# Patient Record
Sex: Female | Born: 1990
Health system: Southern US, Community
[De-identification: ages and names within clinical notes are randomized; demographics above are authoritative.]

## PROBLEM LIST (undated history)

## (undated) ENCOUNTER — Inpatient Hospital Stay (HOSPITAL_COMMUNITY): Payer: Self-pay

## (undated) DIAGNOSIS — N83201 Unspecified ovarian cyst, right side: Secondary | ICD-10-CM

## (undated) DIAGNOSIS — F32A Depression, unspecified: Secondary | ICD-10-CM

## (undated) DIAGNOSIS — D649 Anemia, unspecified: Secondary | ICD-10-CM

## (undated) DIAGNOSIS — R519 Headache, unspecified: Secondary | ICD-10-CM

## (undated) DIAGNOSIS — O24419 Gestational diabetes mellitus in pregnancy, unspecified control: Secondary | ICD-10-CM

## (undated) DIAGNOSIS — F419 Anxiety disorder, unspecified: Secondary | ICD-10-CM

## (undated) DIAGNOSIS — R112 Nausea with vomiting, unspecified: Secondary | ICD-10-CM

## (undated) DIAGNOSIS — N390 Urinary tract infection, site not specified: Secondary | ICD-10-CM

## (undated) DIAGNOSIS — F329 Major depressive disorder, single episode, unspecified: Secondary | ICD-10-CM

## (undated) DIAGNOSIS — N83202 Unspecified ovarian cyst, left side: Secondary | ICD-10-CM

## (undated) DIAGNOSIS — Z9889 Other specified postprocedural states: Secondary | ICD-10-CM

## (undated) DIAGNOSIS — O471 False labor at or after 37 completed weeks of gestation: Principal | ICD-10-CM

## (undated) HISTORY — PX: ADENOIDECTOMY: SUR15

## (undated) HISTORY — PX: ADENOIDECTOMY AND MYRINGOTOMY WITH TUBE PLACEMENT: SHX5714

## (undated) HISTORY — PX: EYE SURGERY: SHX253

---
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
## 2017-07-03 LAB — OB RESULTS CONSOLE GC/CHLAMYDIA
CHLAMYDIA, DNA PROBE: NEGATIVE
GC PROBE AMP, GENITAL: NEGATIVE

## 2017-07-03 LAB — OB RESULTS CONSOLE HEPATITIS B SURFACE ANTIGEN: HEP B S AG: NEGATIVE

## 2017-07-03 LAB — OB RESULTS CONSOLE RUBELLA ANTIBODY, IGM: Rubella: IMMUNE

## 2017-07-03 LAB — OB RESULTS CONSOLE ABO/RH: RH TYPE: POSITIVE

## 2017-07-03 LAB — OB RESULTS CONSOLE HIV ANTIBODY (ROUTINE TESTING): HIV: NONREACTIVE

## 2017-07-03 LAB — OB RESULTS CONSOLE RPR: RPR: NONREACTIVE

## 2017-07-03 LAB — OB RESULTS CONSOLE ANTIBODY SCREEN: ANTIBODY SCREEN: NEGATIVE

## 2017-08-28 NOTE — L&D Delivery Note (Addendum)
Delivery Note  [facility]  03/05/2018  Date of delivery: 03/06/18  Pamela EhrichMeighan A Bell, 27 y.o., @ 7025w4d,  G1P0, who was admitted for latent phase labor with SROM with light meconium. I was called to the room when she progressed +1 station in the second stage of labor. Pt was feeling pressure in her rectum with an epidural, infant was having a prolonged decel, nadir of 60 x 3 mins then slowly returned to baseline of 135s with rollin gpt to left and right, o2 Pamela Bell, and bolus, Dr Mora ApplPinn was called to bedside to help assist.  She pushed for 45hours/min.  She delivered a viable infant, cephalic and restituted to the ROT position over an intact perineum.  A nuchal cord   was not identified. The baby was placed on maternal abdomen while initial step of NRP were perfmored (Dry, Stimulated, and warmed). Hat placed on baby for thermoregulation. Delayed cord clamping was performed for 1 minutes.  Cord double clamped and cut.  Cord cut by father. Apgar scores were 8 and 9. The placenta delivered spontaneously, shultz, with a 3 vessel cord.  Inspection revealed 1st degree. An examination of the vaginal vault and cervix was free from lacerations. The uterus was firm, bleeding stable.  The repair was done under local.   Placenta was sent to LD and umbilical artery blood gas were not sent.  There were no complications during the procedure.  Mom and baby skin to skin following delivery. Left in stable condition.  Maternal Info: Anesthesia:Epidural Episiotomy: no Lacerations:  1st Suture Repair: 2-0 vicryl & 3-0 chromic Est. Blood Loss (mL):  400  Newborn Info: Baby Sex: female Circumcision: No APGAR (1 MIN):   APGAR (5 MINS):   APGAR (10 MINS):     Mom to postpartum.  Baby to Couplet care / Skin to Skin.   Clement Deneault, CNM, NP-C @TODAY @ 2:05 AM

## 2017-10-09 ENCOUNTER — Inpatient Hospital Stay (HOSPITAL_COMMUNITY)
Admission: AD | Admit: 2017-10-09 | Discharge: 2017-10-09 | Disposition: A | Discharge status: Home / Self Care | Payer: Medicaid Other | Source: Ambulatory Visit | Attending: Obstetrics & Gynecology | Admitting: Obstetrics & Gynecology

## 2017-10-09 ENCOUNTER — Encounter (HOSPITAL_COMMUNITY): Payer: Self-pay | Admitting: *Deleted

## 2017-10-09 DIAGNOSIS — O26892 Other specified pregnancy related conditions, second trimester: Secondary | ICD-10-CM | POA: Diagnosis present

## 2017-10-09 DIAGNOSIS — O99512 Diseases of the respiratory system complicating pregnancy, second trimester: Secondary | ICD-10-CM | POA: Insufficient documentation

## 2017-10-09 DIAGNOSIS — O219 Vomiting of pregnancy, unspecified: Secondary | ICD-10-CM

## 2017-10-09 DIAGNOSIS — J101 Influenza due to other identified influenza virus with other respiratory manifestations: Secondary | ICD-10-CM | POA: Insufficient documentation

## 2017-10-09 DIAGNOSIS — Z3A19 19 weeks gestation of pregnancy: Secondary | ICD-10-CM | POA: Diagnosis not present

## 2017-10-09 DIAGNOSIS — R112 Nausea with vomiting, unspecified: Secondary | ICD-10-CM | POA: Diagnosis present

## 2017-10-09 MED ORDER — PROMETHAZINE HCL 12.5 MG PO TABS: 12.5000 mg | ORAL_TABLET | Freq: Four times a day (QID) | ORAL | 2 refills | Status: DC | PRN

## 2017-10-09 NOTE — MAU Provider Note (Signed)
Chief Complaint: Emesis; Nausea; Diarrhea; Fatigue; Sore Throat; and Cough   First Provider Initiated Contact with Patient 10/09/17 1550     SUBJECTIVE HPI: Pamela Bell is a 27 y.o. G1P0 at [redacted]w[redacted]d by LMP who presents to maternity admissions reporting nausea and vomiting after taking Tamiflu. She was diagnosed yesterday afternoon with influenza A and was prescribed Tamiflu- currently on her second dose of medication and c/o vomiting after medication one time this morning. She has Zofran prescribed at home but is unable to take medication d/t it making her even more sick. She denies concerns with pregnancy. She denies vaginal bleeding, vaginal itching/burning, urinary symptoms, h/a, dizziness, n/v, or fever/chills.    No past medical history on file.  Social History   Socioeconomic History  . Marital status: Single    Spouse name: Not on file  . Number of children: Not on file  . Years of education: Not on file  . Highest education level: Not on file  Social Needs  . Financial resource strain: Not on file  . Food insecurity - worry: Not on file  . Food insecurity - inability: Not on file  . Transportation needs - medical: Not on file  . Transportation needs - non-medical: Not on file  Occupational History  . Not on file  Tobacco Use  . Smoking status: Not on file  Substance and Sexual Activity  . Alcohol use: Not on file  . Drug use: Not on file  . Sexual activity: Not on file  Other Topics Concern  . Not on file  Social History Narrative  . Not on file   No current facility-administered medications on file prior to encounter.    No current outpatient medications on file prior to encounter.   Allergies  Allergen Reactions  . Amoxicillin-Pot Clavulanate     REACTION: Hives    ROS:  Review of Systems  Gastrointestinal: Positive for nausea and vomiting.  Genitourinary: Negative.   Musculoskeletal: Negative.   Neurological: Negative.   Psychiatric/Behavioral:  Negative.   Positive for flu symptoms   I have reviewed patient's Past Medical Hx, Surgical Hx, Family Hx, Social Hx, medications and allergies.   Physical Exam   Patient Vitals for the past 24 hrs:  BP Temp Temp src Pulse Resp SpO2 Height Weight  10/09/17 1545 114/72 (!) 97.5 F (36.4 C) Oral 78 18 99 % 5\' 2"  (1.575 m) 169 lb (76.7 kg)   Constitutional: Well-developed, well-nourished female in no acute distress.  Cardiovascular: normal rate Respiratory: normal effort, clear lung sounds bilaterally  GI: Abd soft, non-tender. Pos BS x 4 MS: Extremities nontender, no edema, normal ROM Neurologic: Alert and oriented x 4.  GU: Neg CVAT.  FHT 145 by doppler   MAU Management/MDM:   Meds ordered this encounter  Medications  . promethazine (PHENERGAN) 12.5 MG tablet    Sig: Take 1 tablet (12.5 mg total) by mouth every 6 (six) hours as needed for nausea or vomiting.    Dispense:  30 tablet    Refill:  2    Order Specific Question:   Supervising Provider    Answer:   CONSTANT, PEGGY [[id number]]    Consult Dr Charlotta Newton on assessment and management. Recommends to call office if symptoms worsen and unable to keep medication down  Pt discharged with strict flu precautions and work note to stay home from work and school.  ASSESSMENT 1. Influenza A   2. Nausea and vomiting during pregnancy     PLAN Discharge  home Rx for phenergan prescription given to patient  Follow up as scheduled for prenatal visits   Follow-up Information    [facility]   Specialty:  Obstetrics and Gynecology Why:  Follow up as scheduled for prenatal visits Contact information: [address] [place] [id number] [phone]           Allergies as of 10/09/2017      Reactions   Amoxicillin-pot Clavulanate    REACTION: Hives      Medication List    TAKE these medications   promethazine 12.5 MG tablet Commonly known as:  PHENERGAN Take 1 tablet (12.5 mg  total) by mouth every 6 (six) hours as needed for nausea or vomiting.       Steward DroneVeronica Bob Daversa  Certified Nurse-Midwife 10/09/2017  4:27 PM

## 2017-10-09 NOTE — MAU Note (Signed)
Pt reports she tested positive for flu yesterday, has been sick for a week, vomiting, sore throat, cough, fever, weak, loose stools.

## 2017-12-17 ENCOUNTER — Encounter (HOSPITAL_COMMUNITY): Payer: Self-pay

## 2017-12-17 ENCOUNTER — Inpatient Hospital Stay (HOSPITAL_COMMUNITY)
Admission: AD | Admit: 2017-12-17 | Discharge: 2017-12-17 | Disposition: A | Discharge status: Home / Self Care | Payer: Medicaid Other | Source: Ambulatory Visit | Attending: Obstetrics and Gynecology | Admitting: Obstetrics and Gynecology

## 2017-12-17 DIAGNOSIS — N83201 Unspecified ovarian cyst, right side: Secondary | ICD-10-CM | POA: Diagnosis not present

## 2017-12-17 DIAGNOSIS — O26893 Other specified pregnancy related conditions, third trimester: Secondary | ICD-10-CM | POA: Diagnosis present

## 2017-12-17 DIAGNOSIS — N83202 Unspecified ovarian cyst, left side: Secondary | ICD-10-CM | POA: Insufficient documentation

## 2017-12-17 DIAGNOSIS — O479 False labor, unspecified: Secondary | ICD-10-CM

## 2017-12-17 DIAGNOSIS — F419 Anxiety disorder, unspecified: Secondary | ICD-10-CM | POA: Diagnosis not present

## 2017-12-17 DIAGNOSIS — Z880 Allergy status to penicillin: Secondary | ICD-10-CM | POA: Insufficient documentation

## 2017-12-17 DIAGNOSIS — O3483 Maternal care for other abnormalities of pelvic organs, third trimester: Secondary | ICD-10-CM | POA: Insufficient documentation

## 2017-12-17 DIAGNOSIS — M549 Dorsalgia, unspecified: Secondary | ICD-10-CM | POA: Insufficient documentation

## 2017-12-17 DIAGNOSIS — F329 Major depressive disorder, single episode, unspecified: Secondary | ICD-10-CM | POA: Insufficient documentation

## 2017-12-17 DIAGNOSIS — Z3A29 29 weeks gestation of pregnancy: Secondary | ICD-10-CM

## 2017-12-17 DIAGNOSIS — O99343 Other mental disorders complicating pregnancy, third trimester: Secondary | ICD-10-CM | POA: Insufficient documentation

## 2017-12-17 DIAGNOSIS — O4703 False labor before 37 completed weeks of gestation, third trimester: Secondary | ICD-10-CM

## 2017-12-17 DIAGNOSIS — R109 Unspecified abdominal pain: Secondary | ICD-10-CM | POA: Insufficient documentation

## 2017-12-17 HISTORY — DX: Anxiety disorder, unspecified: F41.9

## 2017-12-17 HISTORY — DX: Unspecified ovarian cyst, right side: N83.201

## 2017-12-17 HISTORY — DX: Depression, unspecified: F32.A

## 2017-12-17 HISTORY — DX: Major depressive disorder, single episode, unspecified: F32.9

## 2017-12-17 HISTORY — DX: Unspecified ovarian cyst, left side: N83.202

## 2017-12-17 LAB — URINALYSIS, ROUTINE W REFLEX MICROSCOPIC
Bilirubin Urine: NEGATIVE
GLUCOSE, UA: NEGATIVE mg/dL
HGB URINE DIPSTICK: NEGATIVE
KETONES UR: NEGATIVE mg/dL
Leukocytes, UA: NEGATIVE
Nitrite: NEGATIVE
PROTEIN: NEGATIVE mg/dL
Specific Gravity, Urine: 1.002 — ABNORMAL LOW (ref 1.005–1.030)
pH: 7 (ref 5.0–8.0)

## 2017-12-17 NOTE — MAU Provider Note (Signed)
Chief Complaint:  Abdominal Pain and Back Pain   First Provider Initiated Contact with Patient 12/17/17 1326      HPI: Pamela Bell is a 27 y.o. G1P0 at 8044w2d who presents to maternity admissions reporting onset of painful cramping and back pain today while standing at clinicals. The pain was enough to make her double over and have to breath through the pain. She was unable to time them so is not sure how far apart they were. They are not as strong but are still happening now, at least 1 in the last 30 minutes.   Last intercourse was last night.  She reports drinking plenty of water. She has not tried any other treatments. There are no other associated symptoms. She reports good fetal movement, denies LOF, vaginal bleeding, vaginal itching/burning, urinary symptoms, h/a, dizziness, n/v, or fever/chills.    HPI  Past Medical History: Past Medical History:  Diagnosis Date  . Anxiety   . Bilateral ovarian cysts   . Depression    takes zoloft    Past obstetric history: OB History  Gravida Para Term Preterm AB Living  1            SAB TAB Ectopic Multiple Live Births               # Outcome Date GA Lbr Len/2nd Weight Sex Delivery Anes PTL Lv  1 Current             Past Surgical History: Past Surgical History:  Procedure Laterality Date  . ADENOIDECTOMY    . ADENOIDECTOMY AND MYRINGOTOMY WITH TUBE PLACEMENT    . EYE SURGERY      Family History: No family history on file.  Social History: Social History   Tobacco Use  . Smoking status: Never Smoker  . Smokeless tobacco: Never Used  Substance Use Topics  . Alcohol use: Not Currently  . Drug use: Never    Allergies:  Allergies  Allergen Reactions  . Amoxicillin-Pot Clavulanate     REACTION: Hives  . Red Dye Hives    Meds:  No medications prior to admission.    ROS:  Review of Systems  Constitutional: Negative for chills, fatigue and fever.  Respiratory: Negative for shortness of breath.   Cardiovascular:  Negative for chest pain.  Gastrointestinal: Positive for abdominal pain. Negative for nausea and vomiting.  Genitourinary: Positive for pelvic pain. Negative for difficulty urinating, dysuria, flank pain, vaginal bleeding, vaginal discharge and vaginal pain.  Musculoskeletal: Positive for back pain.  Neurological: Negative for dizziness and headaches.  Psychiatric/Behavioral: Negative.      I have reviewed patient's Past Medical Hx, Surgical Hx, Family Hx, Social Hx, medications and allergies.   Physical Exam   Patient Vitals for the past 24 hrs:  BP Temp Temp src Pulse Resp SpO2 Height Weight  12/17/17 1408 123/69 98.1 F (36.7 C) Oral 85 16 - - -  12/17/17 1235 128/73 98.4 F (36.9 C) Oral 82 19 98 % 5\' 2"  (1.575 m) 185 lb (83.9 kg)   Constitutional: Well-developed, well-nourished female in no acute distress.  Cardiovascular: normal rate Respiratory: normal effort GI: Abd soft, non-tender, gravid appropriate for gestational age.  MS: Extremities nontender, no edema, normal ROM Neurologic: Alert and oriented x 4.  GU: Neg CVAT.   Dilation: Closed Effacement (%): Thick Exam by:: Sharen CounterLisa Leftwich-Kirby, CNM  FHT:  Baseline 135 , moderate variability, accelerations present, no decelerations Contractions: irritability, irregular, mild to palpation   Labs: Results for  orders placed or performed during the hospital encounter of 12/17/17 (from the past 24 hour(s))  Urinalysis, Routine w reflex microscopic     Status: Abnormal   Collection Time: 12/17/17 12:35 PM  Result Value Ref Range   Color, Urine STRAW (A) YELLOW   APPearance CLEAR CLEAR   Specific Gravity, Urine 1.002 (L) 1.005 - 1.030   pH 7.0 5.0 - 8.0   Glucose, UA NEGATIVE NEGATIVE mg/dL   Hgb urine dipstick NEGATIVE NEGATIVE   Bilirubin Urine NEGATIVE NEGATIVE   Ketones, ur NEGATIVE NEGATIVE mg/dL   Protein, ur NEGATIVE NEGATIVE mg/dL   Nitrite NEGATIVE NEGATIVE   Leukocytes, UA NEGATIVE NEGATIVE      Imaging:   No results found.  MAU Course/MDM: I have ordered labs and reviewed results.  NST reviewed and reactive   No evidence of preterm labor Braxton-hicks contractions may be associated with recent intercourse or increase in physical activity over the weekend and today at clinicals Pt reports some recent panic attacks despite Zoloft daily but she does not want to increase her dose. She is doing well, she reports and thinks she may have too much stress from school right now Consult Dr Dion Body with presentation, exam findings and test results Pt to f/u in office Recommend pt seek counseling, use relaxation techniques to manage stress Pt discharge with strict preterm labor precautions.   Assessment: 1. Braxton Hicks contractions   2. Anxiety during pregnancy, antepartum, third trimester     Plan: Discharge home Labor precautions and fetal kick counts Follow-up Information    Myna Hidalgo, DO Follow up.   Specialty:  Obstetrics and Gynecology Why:  As scheduled, return to MAU as needed for emergencies. Contact information: [address] [place] [address] [phone]          Allergies as of 12/17/2017      Reactions   Amoxicillin-pot Clavulanate    REACTION: Hives   Red Dye Hives      Medication List    TAKE these medications   promethazine 12.5 MG tablet Commonly known as:  PHENERGAN Take 1 tablet (12.5 mg total) by mouth every 6 (six) hours as needed for nausea or vomiting.       Sharen Counter Certified Nurse-Midwife 12/17/2017 9:15 PM

## 2017-12-17 NOTE — MAU Note (Signed)
Pt reports onset of severe cramping about 45 minutes , now radiating into her lower back. Denies bleeding or ROM

## 2017-12-30 ENCOUNTER — Encounter (HOSPITAL_COMMUNITY): Payer: Self-pay | Admitting: *Deleted

## 2017-12-30 ENCOUNTER — Inpatient Hospital Stay (HOSPITAL_COMMUNITY)
Admission: AD | Admit: 2017-12-30 | Discharge: 2017-12-30 | Disposition: A | Discharge status: Home / Self Care | Payer: Medicaid Other | Source: Ambulatory Visit | Attending: Obstetrics & Gynecology | Admitting: Obstetrics & Gynecology

## 2017-12-30 DIAGNOSIS — Z3A31 31 weeks gestation of pregnancy: Secondary | ICD-10-CM | POA: Diagnosis not present

## 2017-12-30 DIAGNOSIS — R1030 Lower abdominal pain, unspecified: Secondary | ICD-10-CM | POA: Diagnosis present

## 2017-12-30 DIAGNOSIS — O4703 False labor before 37 completed weeks of gestation, third trimester: Secondary | ICD-10-CM | POA: Diagnosis not present

## 2017-12-30 LAB — WET PREP, GENITAL
Clue Cells Wet Prep HPF POC: NONE SEEN
SPERM: NONE SEEN
TRICH WET PREP: NONE SEEN
Yeast Wet Prep HPF POC: NONE SEEN

## 2017-12-30 LAB — URINALYSIS, ROUTINE W REFLEX MICROSCOPIC
Bilirubin Urine: NEGATIVE
GLUCOSE, UA: NEGATIVE mg/dL
HGB URINE DIPSTICK: NEGATIVE
KETONES UR: NEGATIVE mg/dL
LEUKOCYTES UA: NEGATIVE
Nitrite: NEGATIVE
PROTEIN: NEGATIVE mg/dL
Specific Gravity, Urine: 1.004 — ABNORMAL LOW (ref 1.005–1.030)
pH: 8 (ref 5.0–8.0)

## 2017-12-30 LAB — POCT FERN TEST: POCT FERN TEST: NEGATIVE

## 2017-12-30 LAB — FETAL FIBRONECTIN: Fetal Fibronectin: NEGATIVE

## 2017-12-30 MED ORDER — NIFEDIPINE 10 MG PO CAPS
10.0000 mg | ORAL_CAPSULE | ORAL | Status: DC | PRN
  Administered 2017-12-30 (×2): 10 mg via ORAL
  Filled 2017-12-30 (×2): qty 1

## 2017-12-30 MED ORDER — NIFEDIPINE 10 MG PO CAPS: 10.0000 mg | ORAL_CAPSULE | Freq: Four times a day (QID) | ORAL | 0 refills | Status: DC | PRN

## 2017-12-30 NOTE — Discharge Instructions (Signed)
Braxton Hicks Contractions °Contractions of the uterus can occur throughout pregnancy, but they are not always a sign that you are in labor. You may have practice contractions called Braxton Hicks contractions. These false labor contractions are sometimes confused with true labor. °What are Braxton Hicks contractions? °Braxton Hicks contractions are tightening movements that occur in the muscles of the uterus before labor. Unlike true labor contractions, these contractions do not result in opening (dilation) and thinning of the cervix. Toward the end of pregnancy (32-34 weeks), Braxton Hicks contractions can happen more often and may become stronger. These contractions are sometimes difficult to tell apart from true labor because they can be very uncomfortable. You should not feel embarrassed if you go to the hospital with false labor. °Sometimes, the only way to tell if you are in true labor is for your health care provider to look for changes in the cervix. The health care provider will do a physical exam and may monitor your contractions. If you are not in true labor, the exam should show that your cervix is not dilating and your water has not broken. °If there are other health problems associated with your pregnancy, it is completely safe for you to be sent home with false labor. You may continue to have Braxton Hicks contractions until you go into true labor. °How to tell the difference between true labor and false labor °True labor °· Contractions last 30-70 seconds. °· Contractions become very regular. °· Discomfort is usually felt in the top of the uterus, and it spreads to the lower abdomen and low back. °· Contractions do not go away with walking. °· Contractions usually become more intense and increase in frequency. °· The cervix dilates and gets thinner. °False labor °· Contractions are usually shorter and not as strong as true labor contractions. °· Contractions are usually irregular. °· Contractions  are often felt in the front of the lower abdomen and in the groin. °· Contractions may go away when you walk around or change positions while lying down. °· Contractions get weaker and are shorter-lasting as time goes on. °· The cervix usually does not dilate or become thin. °Follow these instructions at home: °· Take over-the-counter and prescription medicines only as told by your health care provider. °· Keep up with your usual exercises and follow other instructions from your health care provider. °· Eat and drink lightly if you think you are going into labor. °· If Braxton Hicks contractions are making you uncomfortable: °? Change your position from lying down or resting to walking, or change from walking to resting. °? Sit and rest in a tub of warm water. °? Drink enough fluid to keep your urine pale yellow. Dehydration may cause these contractions. °? Do slow and deep breathing several times an hour. °· Keep all follow-up prenatal visits as told by your health care provider. This is important. °Contact a health care provider if: °· You have a fever. °· You have continuous pain in your abdomen. °Get help right away if: °· Your contractions become stronger, more regular, and closer together. °· You have fluid leaking or gushing from your vagina. °· You pass blood-tinged mucus (bloody show). °· You have bleeding from your vagina. °· You have low back pain that you never had before. °· You feel your baby’s head pushing down and causing pelvic pressure. °· Your baby is not moving inside you as much as it used to. °Summary °· Contractions that occur before labor are called Braxton   Hicks contractions, false labor, or practice contractions. °· Braxton Hicks contractions are usually shorter, weaker, farther apart, and less regular than true labor contractions. True labor contractions usually become progressively stronger and regular and they become more frequent. °· Manage discomfort from Braxton Hicks contractions by  changing position, resting in a warm bath, drinking plenty of water, or practicing deep breathing. °This information is not intended to replace advice given to you by your health care provider. Make sure you discuss any questions you have with your health care provider. °Document Released: 12/28/2016 Document Revised: 12/28/2016 Document Reviewed: 12/28/2016 °Elsevier Interactive Patient Education © 2018 [employer] ° °

## 2017-12-30 NOTE — MAU Note (Addendum)
Patient reports abdominal pain that radiates to lower back. Patient describes pain as cramping in nature. She states it feels like her stomach is in knots at times. Pain 8/10 Tried tylenol extra strength yesterday; helped " a little".  Pain started yesterday but got more severe this am around 1015 and has been more constant. +nausea; denies vomiting.  Reports clear discharge.  Denies vaginal bleeding.  +FM; less movement than usus al since last night

## 2017-12-30 NOTE — MAU Provider Note (Signed)
CC:  Chief Complaint  Patient presents with  . Contractions     First Provider Initiated Contact with Patient 12/30/17 1353      HPI: Pamela Bell is a 27 y.o. year old G1P0 female at [redacted]w[redacted]d weeks gestation who presents to MAU reporting low abdominal pain and tightening every 5 minutes since last night.   Associated Sx:  Vaginal bleeding: Denies Leaking of fluid: Reports feeling dampness in underwear, but no leaking seen. Fetal movement: Normal  Course: Worsening Aggravating or alleviating factors: No improvement with rest and hydration.  O:  Patient Vitals for the past 24 hrs:  BP Temp Temp src Pulse Resp Height Weight  12/30/17 1514 113/61 - - - - - -  12/30/17 1317 126/77 97.9 F (36.6 C) Oral (!) 108 17 - -  12/30/17 1310 - - - - -  (1.575 m) 184 lb (83.5 kg)    General: Patient in mild distress. Heart: Regular rate Lungs: Normal rate and effort Abd: Soft, NT, Gravid, S=D Pelvic: NEFG, negative pooling, negative blood.  Dilation: Closed Effacement (%): Thick Exam by:: Ivonne Andrew, CNM  EFM: 145, Moderate variability, 15 x 15 accelerations, no decelerations Toco: None tracing on toco.  Contractions every 5 minutes palpation, mild-moderate  Results for orders placed or performed during the hospital encounter of 12/30/17 (from the past 24 hour(s))  Urinalysis, Routine w reflex microscopic     Status: Abnormal   Collection Time: 12/30/17  1:11 PM  Result Value Ref Range   Color, Urine YELLOW YELLOW   APPearance CLEAR CLEAR   Specific Gravity, Urine 1.004 (L) 1.005 - 1.030   pH 8.0 5.0 - 8.0   Glucose, UA NEGATIVE NEGATIVE mg/dL   Hgb urine dipstick NEGATIVE NEGATIVE   Bilirubin Urine NEGATIVE NEGATIVE   Ketones, ur NEGATIVE NEGATIVE mg/dL   Protein, ur NEGATIVE NEGATIVE mg/dL   Nitrite NEGATIVE NEGATIVE   Leukocytes, UA NEGATIVE NEGATIVE  Fetal fibronectin     Status: Abnormal   Collection Time: 12/30/17  2:09 PM  Result Value Ref Range   Fetal  Fibronectin NEGATIVE NEGATIVE   Appearance, FETFIB VALID (A) CLEAR  Wet prep, genital     Status: Abnormal   Collection Time: 12/30/17  2:09 PM  Result Value Ref Range   Yeast Wet Prep HPF POC NONE SEEN NONE SEEN   Trich, Wet Prep NONE SEEN NONE SEEN   Clue Cells Wet Prep HPF POC NONE SEEN NONE SEEN   WBC, Wet Prep HPF POC FEW (A) NONE SEEN   Sperm NONE SEEN    Negative fern  Orders Placed This Encounter  Procedures  . Wet prep, genital  . Urinalysis, Routine w reflex microscopic  . Fetal fibronectin  . Encourage fluids  . Discharge patient  [name]  Meds ordered this encounter  Medications  . NIFEdipine (PROCARDIA) capsule 10 mg  . NIFEdipine (PROCARDIA) 10 MG capsule    Sig: Take 1 capsule (10 mg total) by mouth every 6 (six) hours as needed (for greater than 5 contractions per hour).    Dispense:  30 capsule    Refill:  0    Order Specific Question:   Supervising Provider    Answer:   Adam Phenix [[id number]]   Discussed Hx, labs, exam w/ Dr. Normand Sloop. Agrees w/ POC. New orders: Procardia, push fluids, send fetal fibronectin, wet prep and cultures.   A: [redacted]w[redacted]d week IUP Preterm contractions without evidence of active preterm labor.  Fetal fibronectin negative.  No  evidence of ruptured membranes. FHR reactive  P: Discharge home in stable condition per consult with Dr. Normand Sloop. Preterm labor precautions and fetal kick counts. Rx Procardia.  For > than 5 contractions/hour. Follow-up as scheduled for prenatal visit or sooner as needed if symptoms worsen. Return to maternity admissions as needed if symptoms worsen.  Katrinka Blazing, [place], CNM 12/30/2017 4:21 PM  3

## 2017-12-31 LAB — GC/CHLAMYDIA PROBE AMP (~~LOC~~) NOT AT ARMC
CHLAMYDIA, DNA PROBE: NEGATIVE
NEISSERIA GONORRHEA: NEGATIVE

## 2018-02-09 ENCOUNTER — Encounter (HOSPITAL_COMMUNITY): Payer: Self-pay | Admitting: *Deleted

## 2018-02-09 ENCOUNTER — Other Ambulatory Visit: Payer: Self-pay

## 2018-02-09 ENCOUNTER — Observation Stay (HOSPITAL_COMMUNITY)
Admission: AD | Admit: 2018-02-09 | Discharge: 2018-02-10 | Disposition: A | Discharge status: Home / Self Care | Payer: Medicaid Other | Source: Ambulatory Visit | Attending: Obstetrics and Gynecology | Admitting: Obstetrics and Gynecology

## 2018-02-09 DIAGNOSIS — R9431 Abnormal electrocardiogram [ECG] [EKG]: Secondary | ICD-10-CM | POA: Diagnosis present

## 2018-02-09 DIAGNOSIS — O99343 Other mental disorders complicating pregnancy, third trimester: Secondary | ICD-10-CM | POA: Insufficient documentation

## 2018-02-09 DIAGNOSIS — O99513 Diseases of the respiratory system complicating pregnancy, third trimester: Secondary | ICD-10-CM | POA: Insufficient documentation

## 2018-02-09 DIAGNOSIS — Z91030 Bee allergy status: Secondary | ICD-10-CM | POA: Insufficient documentation

## 2018-02-09 DIAGNOSIS — Z91018 Allergy to other foods: Secondary | ICD-10-CM | POA: Insufficient documentation

## 2018-02-09 DIAGNOSIS — Z3689 Encounter for other specified antenatal screening: Secondary | ICD-10-CM

## 2018-02-09 DIAGNOSIS — O368130 Decreased fetal movements, third trimester, not applicable or unspecified: Principal | ICD-10-CM | POA: Insufficient documentation

## 2018-02-09 DIAGNOSIS — F329 Major depressive disorder, single episode, unspecified: Secondary | ICD-10-CM | POA: Insufficient documentation

## 2018-02-09 DIAGNOSIS — Z881 Allergy status to other antibiotic agents status: Secondary | ICD-10-CM | POA: Insufficient documentation

## 2018-02-09 DIAGNOSIS — O9989 Other specified diseases and conditions complicating pregnancy, childbirth and the puerperium: Secondary | ICD-10-CM

## 2018-02-09 DIAGNOSIS — F419 Anxiety disorder, unspecified: Secondary | ICD-10-CM | POA: Insufficient documentation

## 2018-02-09 DIAGNOSIS — Z9102 Food additives allergy status: Secondary | ICD-10-CM | POA: Insufficient documentation

## 2018-02-09 DIAGNOSIS — Z87892 Personal history of anaphylaxis: Secondary | ICD-10-CM | POA: Insufficient documentation

## 2018-02-09 DIAGNOSIS — Z79899 Other long term (current) drug therapy: Secondary | ICD-10-CM | POA: Insufficient documentation

## 2018-02-09 DIAGNOSIS — R0602 Shortness of breath: Secondary | ICD-10-CM | POA: Insufficient documentation

## 2018-02-09 DIAGNOSIS — Z3A37 37 weeks gestation of pregnancy: Secondary | ICD-10-CM | POA: Insufficient documentation

## 2018-02-09 DIAGNOSIS — O368190 Decreased fetal movements, unspecified trimester, not applicable or unspecified: Secondary | ICD-10-CM

## 2018-02-09 DIAGNOSIS — O99891 Other specified diseases and conditions complicating pregnancy: Secondary | ICD-10-CM

## 2018-02-09 DIAGNOSIS — O99413 Diseases of the circulatory system complicating pregnancy, third trimester: Secondary | ICD-10-CM | POA: Insufficient documentation

## 2018-02-09 DIAGNOSIS — R000 Tachycardia, unspecified: Secondary | ICD-10-CM | POA: Insufficient documentation

## 2018-02-09 HISTORY — DX: Other specified postprocedural states: Z98.890

## 2018-02-09 HISTORY — DX: Nausea with vomiting, unspecified: R11.2

## 2018-02-09 NOTE — MAU Provider Note (Signed)
Chief Complaint:  Decreased Fetal Movement   First Provider Initiated Contact with Patient 02/09/18 2354      HPI: Pamela Bell is a 27 y.o. G1P0 at 3037w0dwho presents to maternity admissions reporting she has not felt the baby move as much as usual today.  She has tried to change positions, drink water, and poke her abdomen but the baby has only moved a few times today.  She also reports some intermittent shortness of breath and feeling like her heart is racing. This started 2 weeks ago and occurs at least once every day. It occurs at rest.  It is associated with chest pressure.  There are no other symptoms. She has not tried other treatments. She has no known cardiac hx. She reports some intermittent irregular cramping that is a little more frequent today than it has been this week.  She has hx of anxiety but feels like this heart racing symptom is not like her usual anxiety.  HPI  Past Medical History: Past Medical History:  Diagnosis Date  . Anxiety   . Bilateral ovarian cysts   . Depression    takes zoloft  . PONV (postoperative nausea and vomiting)     Past obstetric history: OB History  Gravida Para Term Preterm AB Living  1            SAB TAB Ectopic Multiple Live Births               # Outcome Date GA Lbr Len/2nd Weight Sex Delivery Anes PTL Lv  1 Current             Past Surgical History: Past Surgical History:  Procedure Laterality Date  . ADENOIDECTOMY    . ADENOIDECTOMY AND MYRINGOTOMY WITH TUBE PLACEMENT    . EYE SURGERY      Family History: Family History  Problem Relation Age of Onset  . ADD / ADHD Neg Hx   . Alcohol abuse Neg Hx   . Anxiety disorder Neg Hx   . Arthritis Neg Hx   . Asthma Neg Hx   . Birth defects Neg Hx   . Cancer Neg Hx   . COPD Neg Hx   . Depression Neg Hx   . Diabetes Neg Hx   . Early death Neg Hx   . Drug abuse Neg Hx   . Hearing loss Neg Hx   . Heart disease Neg Hx   . Hyperlipidemia Neg Hx   . Intellectual disability Neg  Hx   . Hypertension Neg Hx   . Kidney disease Neg Hx   . Learning disabilities Neg Hx   . Miscarriages / Stillbirths Neg Hx   . Obesity Neg Hx   . Stroke Neg Hx   . Vision loss Neg Hx   . Varicose Veins Neg Hx     Social History: Social History   Tobacco Use  . Smoking status: Never Smoker  . Smokeless tobacco: Never Used  Substance Use Topics  . Alcohol use: Not Currently  . Drug use: Never    Allergies:  Allergies  Allergen Reactions  . Bee Venom Anaphylaxis  . Cranberry Anaphylaxis  . Amoxicillin-Pot Clavulanate     REACTION: Hives  . Red Dye Hives    Meds:  Medications Prior to Admission  Medication Sig Dispense Refill Last Dose  . acetaminophen (TYLENOL) 500 MG tablet Take 1,000 mg by mouth every 6 (six) hours as needed for mild pain.   Past Week at Unknown time  .  Prenatal Vit-Fe Fumarate-FA (PRENATAL MULTIVITAMIN) TABS tablet Take 1 tablet by mouth daily at 12 noon.   02/08/2018 at Unknown time  . sertraline (ZOLOFT) 50 MG tablet Take 50 mg by mouth daily.   02/08/2018 at Unknown time  . NIFEdipine (PROCARDIA) 10 MG capsule Take 1 capsule (10 mg total) by mouth every 6 (six) hours as needed (for greater than 5 contractions per hour). 30 capsule 0 More than a month at Unknown time  . promethazine (PHENERGAN) 12.5 MG tablet Take 1 tablet (12.5 mg total) by mouth every 6 (six) hours as needed for nausea or vomiting. 30 tablet 2 More than a month at Unknown time    ROS:  Review of Systems  Constitutional: Negative for chills, fatigue and fever.  Eyes: Negative for visual disturbance.  Respiratory: Positive for chest tightness and shortness of breath.   Cardiovascular: Positive for palpitations. Negative for chest pain.  Gastrointestinal: Negative for abdominal pain, nausea and vomiting.  Genitourinary: Negative for difficulty urinating, dysuria, flank pain, pelvic pain, vaginal bleeding, vaginal discharge and vaginal pain.  Neurological: Negative for dizziness and  headaches.  Psychiatric/Behavioral: Negative.      I have reviewed patient's Past Medical Hx, Surgical Hx, Family Hx, Social Hx, medications and allergies.   Physical Exam   Patient Vitals for the past 24 hrs:  BP Temp Temp src Pulse Resp  02/09/18 2217 123/79 98 F (36.7 C) Oral 81 19   Constitutional: Well-developed, well-nourished female in no acute distress.  HEART: normal rate, heart sounds, regular rhythm RESP: normal effort, lung sounds clear and equal bilaterally GI: Abd soft, non-tender, gravid appropriate for gestational age.  MS: Extremities nontender, no edema, normal ROM Neurologic: Alert and oriented x 4.  GU: Neg CVAT.   Dilation: Closed Effacement (%): 70 Cervical Position: Middle Exam by:: Arbie Cookey CNM   FHT:  Baseline 125 , moderate variability, accelerations present, no decelerations Contractions: q 3-7 mins, mild to palpation   Labs: No results found for this or any previous visit (from the past 24 hour(s)).   EKG: NSR, possible left atrial enlargement, anteroseptal infarct, age undetermined, abnormal EKG  Imaging:  No results found.  MAU Course/MDM: No evidence of labor with closed cervix NST reviewed and reactive but pt continues to report minimal movement despite PO fluids and rest in MAU BPP ordered. Result 8/8 so 10/10 with reactive tracing.  Discussed these findings with pt who was reassured.  She does report fetal movement in MAU but less than usual. EKG done and NSR but possible left atrial enlargement/anterosseptal infarct noted.  Cardiology paged.   Consult Dr Dion Body with assessment and findings.  Dr Dion Body contacted cardiology directly.  Per Dr Mayford Knife, pt should have echo prior to discharge.  Cardiac echo ordered stat, likely to be performed after 7 am with staffing Admit to [facility] Unit for observation, cardiac echo in the am Doppler Q shift, regular diet, up ad lib  Assessment: 1. Abnormal finding on EKG   2. Decreased  fetal movement   3. Shortness of breath during pregnancy   4. Racing heart beat   5. NST (non-stress test) reactive     Plan: Observation in [facility]OB Unit Maternal cardiac echo in the am   Sharen Counter Certified Nurse-Midwife 02/10/2018 2:44 AM

## 2018-02-09 NOTE — MAU Note (Signed)
Pt presents to MAU c/o no FM since 1300. Pt reports she has tried to lay down drink cold fluids eat and poke the baby but she has not felt movement. Pt denies bleeding or LOF and reports and occasional braxton hick ctx. Pt reports over the last two weeks she has felt as if her heart is racing even when she is just lying or sitting. Pt reports it occasionally makes her SOB. Pt states she reported this at her last OB appt but it has since worsened. Pt states at her last dr visit the FHR was also elevated pt received a NST and they said it was normal.

## 2018-02-10 ENCOUNTER — Other Ambulatory Visit: Payer: Self-pay | Admitting: Cardiology

## 2018-02-10 ENCOUNTER — Other Ambulatory Visit: Payer: Self-pay

## 2018-02-10 ENCOUNTER — Inpatient Hospital Stay (HOSPITAL_COMMUNITY): Payer: Medicaid Other

## 2018-02-10 DIAGNOSIS — R000 Tachycardia, unspecified: Secondary | ICD-10-CM | POA: Diagnosis not present

## 2018-02-10 DIAGNOSIS — R9431 Abnormal electrocardiogram [ECG] [EKG]: Secondary | ICD-10-CM | POA: Diagnosis present

## 2018-02-10 DIAGNOSIS — Z79899 Other long term (current) drug therapy: Secondary | ICD-10-CM | POA: Diagnosis not present

## 2018-02-10 DIAGNOSIS — Z87892 Personal history of anaphylaxis: Secondary | ICD-10-CM | POA: Diagnosis not present

## 2018-02-10 DIAGNOSIS — F329 Major depressive disorder, single episode, unspecified: Secondary | ICD-10-CM | POA: Diagnosis not present

## 2018-02-10 DIAGNOSIS — I361 Nonrheumatic tricuspid (valve) insufficiency: Secondary | ICD-10-CM

## 2018-02-10 DIAGNOSIS — O99343 Other mental disorders complicating pregnancy, third trimester: Secondary | ICD-10-CM | POA: Diagnosis not present

## 2018-02-10 DIAGNOSIS — Z91030 Bee allergy status: Secondary | ICD-10-CM | POA: Diagnosis not present

## 2018-02-10 DIAGNOSIS — O368130 Decreased fetal movements, third trimester, not applicable or unspecified: Secondary | ICD-10-CM | POA: Diagnosis not present

## 2018-02-10 DIAGNOSIS — Z3A37 37 weeks gestation of pregnancy: Secondary | ICD-10-CM | POA: Diagnosis not present

## 2018-02-10 DIAGNOSIS — O99413 Diseases of the circulatory system complicating pregnancy, third trimester: Secondary | ICD-10-CM | POA: Diagnosis not present

## 2018-02-10 DIAGNOSIS — Z9102 Food additives allergy status: Secondary | ICD-10-CM | POA: Diagnosis not present

## 2018-02-10 DIAGNOSIS — F419 Anxiety disorder, unspecified: Secondary | ICD-10-CM | POA: Diagnosis not present

## 2018-02-10 DIAGNOSIS — R002 Palpitations: Secondary | ICD-10-CM

## 2018-02-10 DIAGNOSIS — Z91018 Allergy to other foods: Secondary | ICD-10-CM | POA: Diagnosis not present

## 2018-02-10 DIAGNOSIS — O99513 Diseases of the respiratory system complicating pregnancy, third trimester: Secondary | ICD-10-CM | POA: Diagnosis not present

## 2018-02-10 DIAGNOSIS — R0602 Shortness of breath: Secondary | ICD-10-CM | POA: Diagnosis not present

## 2018-02-10 DIAGNOSIS — Z881 Allergy status to other antibiotic agents status: Secondary | ICD-10-CM | POA: Diagnosis not present

## 2018-02-10 LAB — TYPE AND SCREEN
ABO/RH(D): O POS
Antibody Screen: NEGATIVE

## 2018-02-10 LAB — ECHOCARDIOGRAM COMPLETE
HEIGHTINCHES: 62 in
WEIGHTICAEL: 3008 [oz_av]

## 2018-02-10 LAB — ABO/RH: ABO/RH(D): O POS

## 2018-02-10 MED ORDER — SERTRALINE HCL 50 MG PO TABS
50.0000 mg | ORAL_TABLET | Freq: Every day | ORAL | Status: DC
  Filled 2018-02-10: qty 1

## 2018-02-10 MED ORDER — SERTRALINE HCL 50 MG PO TABS
50.0000 mg | ORAL_TABLET | Freq: Every day | ORAL | Status: DC
  Administered 2018-02-10: 50 mg via ORAL
  Filled 2018-02-10 (×2): qty 1

## 2018-02-10 MED ORDER — ACETAMINOPHEN 325 MG PO TABS
650.0000 mg | ORAL_TABLET | ORAL | Status: DC | PRN
Start: 2018-02-10 — End: 2018-02-10

## 2018-02-10 NOTE — Progress Notes (Signed)
  Echocardiogram 2D Echocardiogram has been performed.  Delcie RochENNINGTON, Caiden Monsivais 02/10/2018, 8:33 AM

## 2018-02-10 NOTE — Progress Notes (Signed)
Discharged home in stable condition, ambulatory. 

## 2018-02-10 NOTE — Progress Notes (Signed)
Discharge instructions given to patient and she verbalized understanding of all instructions given. Written copy of AVS given to patient. 

## 2018-02-10 NOTE — Progress Notes (Signed)
   02/10/18 0259  Vital Signs  BP 125/77  BP Location Right Arm  Patient Position (if appropriate) Semi-fowlers  BP Method Automatic  Pulse Rate 74  Pulse Rate Source Monitor  Resp 16  Temp (!) 97.5 F (36.4 C)  Temp Source Oral  Oxygen Therapy  SpO2 100 %  Received patient from MAU, awake, alert and oriented x 4. Pt denies chest pain or any other pain. Pt settled comfortable in bed, oriented to room, SCD on and call bell in close reach. Pt ask if her Mom could come and spent the night with her and she was told yes. She was given a sandwich and plan of care was discussed. Pt verbalized understanding of same.

## 2018-02-24 ENCOUNTER — Encounter (HOSPITAL_COMMUNITY): Payer: Self-pay | Admitting: *Deleted

## 2018-02-24 ENCOUNTER — Other Ambulatory Visit: Payer: Self-pay

## 2018-02-24 ENCOUNTER — Inpatient Hospital Stay (HOSPITAL_COMMUNITY)
Admission: AD | Admit: 2018-02-24 | Discharge: 2018-02-24 | Disposition: A | Discharge status: Home / Self Care | Payer: Medicaid Other | Source: Ambulatory Visit | Attending: Obstetrics & Gynecology | Admitting: Obstetrics & Gynecology

## 2018-02-24 DIAGNOSIS — Z3A39 39 weeks gestation of pregnancy: Secondary | ICD-10-CM | POA: Insufficient documentation

## 2018-02-24 DIAGNOSIS — O479 False labor, unspecified: Secondary | ICD-10-CM

## 2018-02-24 DIAGNOSIS — O471 False labor at or after 37 completed weeks of gestation: Secondary | ICD-10-CM | POA: Diagnosis not present

## 2018-02-24 DIAGNOSIS — Z0371 Encounter for suspected problem with amniotic cavity and membrane ruled out: Secondary | ICD-10-CM | POA: Insufficient documentation

## 2018-02-24 DIAGNOSIS — O26893 Other specified pregnancy related conditions, third trimester: Secondary | ICD-10-CM | POA: Diagnosis present

## 2018-02-24 LAB — AMNISURE RUPTURE OF MEMBRANE (ROM) NOT AT ARMC: Amnisure ROM: NEGATIVE

## 2018-02-24 NOTE — MAU Note (Signed)
Pt report leaking of since 1830. Neg GBS per Pt report. + FM

## 2018-02-24 NOTE — MAU Provider Note (Signed)
Chief Complaint:  Rupture of Membranes   [facility] pt.    HPI: Pamela Bell is a 27 y.o. G1P0 at [redacted]w[redacted]d who presents to maternity admissions reporting felt gush of fluids at 7pm and is not sure if she ruptured or not. Denies contractions, nor vaginal bleeding. Good fetal movement.   Pregnancy Course:   Past Medical History:  Diagnosis Date  . Anxiety   . Bilateral ovarian cysts   . Depression    takes zoloft  . PONV (postoperative nausea and vomiting)    OB History  Gravida Para Term Preterm AB Living  1            SAB TAB Ectopic Multiple Live Births               # Outcome Date GA Lbr Len/2nd Weight Sex Delivery Anes PTL Lv  1 Current            Past Surgical History:  Procedure Laterality Date  . ADENOIDECTOMY    . ADENOIDECTOMY AND MYRINGOTOMY WITH TUBE PLACEMENT    . EYE SURGERY     Family History  Problem Relation Age of Onset  . ADD / ADHD Neg Hx   . Alcohol abuse Neg Hx   . Anxiety disorder Neg Hx   . Arthritis Neg Hx   . Asthma Neg Hx   . Birth defects Neg Hx   . Cancer Neg Hx   . COPD Neg Hx   . Depression Neg Hx   . Diabetes Neg Hx   . Early death Neg Hx   . Drug abuse Neg Hx   . Hearing loss Neg Hx   . Heart disease Neg Hx   . Hyperlipidemia Neg Hx   . Intellectual disability Neg Hx   . Hypertension Neg Hx   . Kidney disease Neg Hx   . Learning disabilities Neg Hx   . Miscarriages / Stillbirths Neg Hx   . Obesity Neg Hx   . Stroke Neg Hx   . Vision loss Neg Hx   . Varicose Veins Neg Hx    Social History   Tobacco Use  . Smoking status: Never Smoker  . Smokeless tobacco: Never Used  Substance Use Topics  . Alcohol use: Not Currently  . Drug use: Never   Allergies  Allergen Reactions  . Bee Venom Anaphylaxis  . Cranberry Anaphylaxis  . Amoxicillin-Pot Clavulanate     REACTION: Hives Has patient had a PCN reaction causing immediate rash, facial/tongue/throat swelling, SOB or lightheadedness with hypotension: No Has patient had a PCN  reaction causing severe rash involving mucus membranes or skin necrosis: No Has patient had a PCN reaction that required hospitalization: No Has patient had a PCN reaction occurring within the last 10 years: No If all of the above answers are "NO", then may proceed with Cephalosporin use.   . Red Dye Hives   Medications Prior to Admission  Medication Sig Dispense Refill Last Dose  . acetaminophen (TYLENOL) 500 MG tablet Take 1,000 mg by mouth every 6 (six) hours as needed for mild pain.   Past Week at Unknown time  . Prenatal Vit-Fe Fumarate-FA (PRENATAL MULTIVITAMIN) TABS tablet Take 1 tablet by mouth daily at 12 noon.   02/23/2018 at Unknown time  . sertraline (ZOLOFT) 50 MG tablet Take 50 mg by mouth daily.   02/23/2018 at Unknown time  . NIFEdipine (PROCARDIA) 10 MG capsule Take 1 capsule (10 mg total) by mouth every 6 (six) hours as  needed (for greater than 5 contractions per hour). (Patient not taking: Reported on 02/10/2018) 30 capsule 0 Not Taking at Unknown time  . promethazine (PHENERGAN) 12.5 MG tablet Take 1 tablet (12.5 mg total) by mouth every 6 (six) hours as needed for nausea or vomiting. (Patient not taking: Reported on 02/10/2018) 30 tablet 2 More than a month at Unknown time    I have reviewed patient's Past Medical Hx, Surgical Hx, Family Hx, Social Hx, medications and allergies.   ROS:  Review of Systems  All other systems reviewed and are negative.   Physical Exam   Patient Vitals for the past 24 hrs:  BP Temp Pulse Resp SpO2 Height Weight  02/24/18 2143 128/76 - - - - - -  02/24/18 2046 129/77 - 93 - - - -  02/24/18 2021 138/78 98.1 F (36.7 C) (!) 104 18 99 % 5\' 2"  (1.575 m) 86.6 kg (191 lb)   Constitutional: Well-developed, well-nourished female in no acute distress.  Cardiovascular: normal rate Respiratory: normal effort GI: Abd soft, non-tender, gravid appropriate for gestational age. Pos BS x 4 MS: Extremities nontender, no edema, normal ROM Neurologic:  Alert and oriented x 4.  GU: Neg CVAT.   NST: FHR baseline 130s bpm, Variability: moderate, Accelerations:present, Decelerations:  Absent= Cat 1/Reactive UC:   none, uterine irritability    Labs: Results for orders placed or performed during the hospital encounter of 02/24/18 (from the past 24 hour(s))  Amnisure rupture of membrane (rom)not at [facility]     Status: None   Collection Time: 02/24/18  9:02 PM  Result Value Ref Range   Amnisure ROM NEGATIVE     Imaging:  [place] Mfm Fetal Bpp Wo Non Stress  Result Date: 02/10/2018 ----------------------------------------------------------------------  OBSTETRICS REPORT                      (Signed Final 02/10/2018 12:49 pm) ---------------------------------------------------------------------- Patient Info  ID #:       [id number]                          D.O.B.:  07/13/1991 (27 yrs)  Name:       Pamela Bell [facility]                 Visit Date: 02/10/2018 01:19 am ---------------------------------------------------------------------- Performed By  Performed By:     Earley Brooke     Ref. Address:     [address]                    [place], [place]                                                             Rd                                                             Jacky Kindle  Attending:        Particia Nearing MD       Secondary Phy.:   MAU Nursing-  MAU/Triage  Referred By:      Curtis Sites-       Location:         [facility] CNM ---------------------------------------------------------------------- Orders   #  Description                                 Code   1  [place] MFM FETAL BPP WO NON STRESS              76819.01  ----------------------------------------------------------------------   #  Ordered By               Order #        Accession #    Episode #   1  LISA LEFTWICH-           [id number]       [id number]     [id number]      KIRBY   ---------------------------------------------------------------------- Indications   [redacted] weeks gestation of pregnancy                Z3A.37   Decreased fetal movements, third trimester,    O36.8130   unspecified  ---------------------------------------------------------------------- Fetal Evaluation  Num Of Fetuses:     1  Fetal Heart         131  Rate(bpm):  Cardiac Activity:   Observed  Presentation:       Cephalic  Amniotic Fluid  AFI FV:      Subjectively within normal limits  AFI Sum(cm)     %Tile       Largest Pocket(cm)  13.33           49          4.77  RUQ(cm)       RLQ(cm)       LUQ(cm)        LLQ(cm)  3.82          0.69          4.77           4.05 ---------------------------------------------------------------------- Biophysical Evaluation  Amniotic F.V:   Within normal limits       F. Tone:        Observed  F. Movement:    Observed                   Score:          8/8  F. Breathing:   Observed ---------------------------------------------------------------------- Gestational Age  Clinical EDD:  37w 1d                                        EDD:   03/02/18  Best:          37w 1d     Det. By:  Clinical EDD             EDD:   03/02/18 ---------------------------------------------------------------------- Impression  SIUP at 37+1 weeks  Cephalic presentation  Normal amniotic fluid volume  BPP 8/8 ---------------------------------------------------------------------- Recommendations  Follow-up as clinically indicated ----------------------------------------------------------------------                 Particia Nearing, MD Electronically Signed Final Report   02/10/2018 12:49 pm ----------------------------------------------------------------------   MAU Course:  Orders Placed This Encounter  Procedures  . [facility]  . Diet - low sodium heart healthy  . Contraction - monitoring  . External fetal heart monitoring  . Vaginal exam  . Increase activity slowly  . POCT  fern test  . Discharge patient Discharge disposition: 01-Home or Self Care; Discharge patient date: 02/24/2018   No orders of the defined types were placed in this encounter.   MDM: NST, amnisure neg, discharge home, false labor   Assessment:  Deedra EhrichMeighan A Glasper is a 27 y.o. G1P0 at 5435w1d presented for rule out labor, rupture of membranes, neg ferning and neg amnisure. Pt stable and discharge home.  1. False labor     Plan: Discharge home in stable condition.  Labor precautions and fetal kick counts Follow-up Information    [facility], [facility]   Specialty:  Obstetrics and Gynecology Why:  [facility]  Contact information: [address] [place] [address] [phone]           Allergies as of 02/24/2018      Reactions   Bee Venom Anaphylaxis   Cranberry Anaphylaxis   Amoxicillin-pot Clavulanate    REACTION: Hives Has patient had a PCN reaction causing immediate rash, facial/tongue/throat swelling, SOB or lightheadedness with hypotension: No Has patient had a PCN reaction causing severe rash involving mucus membranes or skin necrosis: No Has patient had a PCN reaction that required hospitalization: No Has patient had a PCN reaction occurring within the last 10 years: No If all of the above answers are "NO", then may proceed with Cephalosporin use.   Red Dye Hives      Medication List    STOP taking these medications   NIFEdipine 10 MG capsule Commonly known as:  PROCARDIA     TAKE these medications   acetaminophen 500 MG tablet Commonly known as:  TYLENOL Take 1,000 mg by mouth every 6 (six) hours as needed for mild pain.   prenatal multivitamin Tabs tablet Take 1 tablet by mouth daily at 12 noon.   promethazine 12.5 MG tablet Commonly known as:  PHENERGAN Take 1 tablet (12.5 mg total) by mouth every 6 (six) hours as needed for nausea or vomiting.   sertraline 50 MG tablet Commonly known as:  ZOLOFT Take  50 mg by mouth daily.       [facility] HospitalJade Kemal Amores NP-C, CNM [place], Bravery [place], [place] 02/24/2018 9:48 PM

## 2018-02-24 NOTE — Progress Notes (Signed)
Spoke to [facility]/CNM; no fluid detected when attempting Fern swab. Amnisure ordered

## 2018-02-25 ENCOUNTER — Telehealth (HOSPITAL_COMMUNITY): Payer: Self-pay | Admitting: *Deleted

## 2018-02-25 ENCOUNTER — Encounter (HOSPITAL_COMMUNITY): Payer: Self-pay | Admitting: *Deleted

## 2018-02-25 NOTE — Telephone Encounter (Signed)
Preadmission screen  

## 2018-03-04 ENCOUNTER — Inpatient Hospital Stay (HOSPITAL_COMMUNITY)
Admission: AD | Admit: 2018-03-04 | Discharge: 2018-03-05 | Disposition: A | Discharge status: Home / Self Care | Payer: Medicaid Other | Source: Ambulatory Visit | Attending: Obstetrics and Gynecology | Admitting: Obstetrics and Gynecology

## 2018-03-04 ENCOUNTER — Other Ambulatory Visit: Payer: Self-pay

## 2018-03-04 ENCOUNTER — Encounter (HOSPITAL_COMMUNITY): Payer: Self-pay | Admitting: *Deleted

## 2018-03-04 DIAGNOSIS — Z3A40 40 weeks gestation of pregnancy: Secondary | ICD-10-CM | POA: Insufficient documentation

## 2018-03-04 DIAGNOSIS — O471 False labor at or after 37 completed weeks of gestation: Secondary | ICD-10-CM | POA: Diagnosis present

## 2018-03-04 DIAGNOSIS — O479 False labor, unspecified: Secondary | ICD-10-CM | POA: Diagnosis present

## 2018-03-04 DIAGNOSIS — O26893 Other specified pregnancy related conditions, third trimester: Secondary | ICD-10-CM

## 2018-03-04 HISTORY — DX: False labor at or after 37 completed weeks of gestation: O47.1

## 2018-03-04 HISTORY — DX: False labor, unspecified: O47.9

## 2018-03-04 LAB — AMNISURE RUPTURE OF MEMBRANE (ROM) NOT AT ARMC: Amnisure ROM: NEGATIVE

## 2018-03-04 NOTE — MAU Note (Signed)
Pt presented with c/o of leaking and pressure.  L&D will assess for us

## 2018-03-04 NOTE — Progress Notes (Signed)
SVE remains unchanged after 2 hours. Will contact Dr. Dion BodyVarnado for discharge orders. E. Mattheo Swindle RN

## 2018-03-04 NOTE — MAU Note (Signed)
Pt states woke up leaking this morning, states not urine and is clear and watery and she needs a pad.

## 2018-03-05 ENCOUNTER — Inpatient Hospital Stay (HOSPITAL_COMMUNITY): Payer: Medicaid Other | Admitting: Anesthesiology

## 2018-03-05 ENCOUNTER — Encounter (HOSPITAL_COMMUNITY): Payer: Self-pay | Admitting: *Deleted

## 2018-03-05 ENCOUNTER — Other Ambulatory Visit: Payer: Self-pay

## 2018-03-05 ENCOUNTER — Inpatient Hospital Stay (HOSPITAL_COMMUNITY)
Admission: AD | Admit: 2018-03-05 | Discharge: 2018-03-08 | DRG: 807 | Disposition: A | Discharge status: Home / Self Care | Payer: Medicaid Other | Source: Ambulatory Visit | Attending: Obstetrics and Gynecology | Admitting: Obstetrics and Gynecology

## 2018-03-05 DIAGNOSIS — Z3A40 40 weeks gestation of pregnancy: Secondary | ICD-10-CM | POA: Diagnosis not present

## 2018-03-05 DIAGNOSIS — O479 False labor, unspecified: Secondary | ICD-10-CM

## 2018-03-05 DIAGNOSIS — O471 False labor at or after 37 completed weeks of gestation: Secondary | ICD-10-CM

## 2018-03-05 DIAGNOSIS — F329 Major depressive disorder, single episode, unspecified: Secondary | ICD-10-CM | POA: Diagnosis present

## 2018-03-05 DIAGNOSIS — O99344 Other mental disorders complicating childbirth: Secondary | ICD-10-CM | POA: Diagnosis present

## 2018-03-05 DIAGNOSIS — F419 Anxiety disorder, unspecified: Secondary | ICD-10-CM | POA: Diagnosis present

## 2018-03-05 LAB — CBC
HCT: 38.1 % (ref 36.0–46.0)
Hemoglobin: 12.5 g/dL (ref 12.0–15.0)
MCH: 28.2 pg (ref 26.0–34.0)
MCHC: 32.8 g/dL (ref 30.0–36.0)
MCV: 85.8 fL (ref 78.0–100.0)
PLATELETS: 232 10*3/uL (ref 150–400)
RBC: 4.44 MIL/uL (ref 3.87–5.11)
RDW: 14.5 % (ref 11.5–15.5)
WBC: 14.6 10*3/uL — ABNORMAL HIGH (ref 4.0–10.5)

## 2018-03-05 LAB — TYPE AND SCREEN
ABO/RH(D): O POS
Antibody Screen: NEGATIVE

## 2018-03-05 MED ORDER — PHENYLEPHRINE 40 MCG/ML (10ML) SYRINGE FOR IV PUSH (FOR BLOOD PRESSURE SUPPORT)
80.0000 ug | PREFILLED_SYRINGE | INTRAVENOUS | Status: DC | PRN
  Filled 2018-03-05: qty 5

## 2018-03-05 MED ORDER — FENTANYL CITRATE (PF) 100 MCG/2ML IJ SOLN
50.0000 ug | INTRAMUSCULAR | Status: DC | PRN
  Administered 2018-03-05: 100 ug via INTRAVENOUS
  Filled 2018-03-05: qty 2

## 2018-03-05 MED ORDER — OXYTOCIN 40 UNITS IN LACTATED RINGERS INFUSION - SIMPLE MED
1.0000 m[IU]/min | INTRAVENOUS | Status: DC
  Administered 2018-03-05 (×2): 2 m[IU]/min via INTRAVENOUS
  Filled 2018-03-05: qty 1000

## 2018-03-05 MED ORDER — OXYCODONE-ACETAMINOPHEN 5-325 MG PO TABS: 2.0000 | ORAL_TABLET | ORAL | Status: DC | PRN

## 2018-03-05 MED ORDER — PHENYLEPHRINE 40 MCG/ML (10ML) SYRINGE FOR IV PUSH (FOR BLOOD PRESSURE SUPPORT)
80.0000 ug | PREFILLED_SYRINGE | INTRAVENOUS | Status: DC | PRN
  Administered 2018-03-05: 80 ug via INTRAVENOUS
  Filled 2018-03-05: qty 5
  Filled 2018-03-05: qty 10

## 2018-03-05 MED ORDER — TERBUTALINE SULFATE 1 MG/ML IJ SOLN
0.2500 mg | Freq: Once | INTRAMUSCULAR | Status: DC | PRN
  Filled 2018-03-05: qty 1

## 2018-03-05 MED ORDER — FENTANYL 2.5 MCG/ML BUPIVACAINE 1/10 % EPIDURAL INFUSION (WH - ANES)
14.0000 mL/h | INTRAMUSCULAR | Status: DC | PRN
  Administered 2018-03-05 (×2): 14 mL/h via EPIDURAL
  Filled 2018-03-05 (×2): qty 100

## 2018-03-05 MED ORDER — EPHEDRINE 5 MG/ML INJ
10.0000 mg | INTRAVENOUS | Status: DC | PRN
  Filled 2018-03-05: qty 2

## 2018-03-05 MED ORDER — LIDOCAINE HCL (PF) 1 % IJ SOLN
INTRAMUSCULAR | Status: DC | PRN
  Administered 2018-03-05: 6 mL via EPIDURAL
  Administered 2018-03-05: 4 mL via EPIDURAL

## 2018-03-05 MED ORDER — LACTATED RINGERS IV SOLN
500.0000 mL | Freq: Once | INTRAVENOUS | Status: AC
  Administered 2018-03-05: 500 mL via INTRAVENOUS

## 2018-03-05 MED ORDER — OXYTOCIN 40 UNITS IN LACTATED RINGERS INFUSION - SIMPLE MED: 2.5000 [IU]/h | INTRAVENOUS | Status: DC

## 2018-03-05 MED ORDER — LIDOCAINE HCL (PF) 1 % IJ SOLN
30.0000 mL | INTRAMUSCULAR | Status: AC | PRN
  Administered 2018-03-06: 30 mL via SUBCUTANEOUS
  Filled 2018-03-05: qty 30

## 2018-03-05 MED ORDER — ONDANSETRON HCL 4 MG/2ML IJ SOLN
4.0000 mg | Freq: Four times a day (QID) | INTRAMUSCULAR | Status: DC | PRN
  Administered 2018-03-05: 4 mg via INTRAVENOUS
  Filled 2018-03-05: qty 2

## 2018-03-05 MED ORDER — OXYTOCIN BOLUS FROM INFUSION
500.0000 mL | Freq: Once | INTRAVENOUS | Status: AC
  Administered 2018-03-06: 500 mL via INTRAVENOUS

## 2018-03-05 MED ORDER — DIPHENHYDRAMINE HCL 50 MG/ML IJ SOLN: 12.5000 mg | INTRAMUSCULAR | Status: DC | PRN

## 2018-03-05 MED ORDER — LACTATED RINGERS IV SOLN
500.0000 mL | INTRAVENOUS | Status: DC | PRN
  Administered 2018-03-05 – 2018-03-06 (×2): 500 mL via INTRAVENOUS

## 2018-03-05 MED ORDER — LACTATED RINGERS IV SOLN
INTRAVENOUS | Status: DC
  Administered 2018-03-05 (×2): via INTRAVENOUS

## 2018-03-05 MED ORDER — OXYCODONE-ACETAMINOPHEN 5-325 MG PO TABS: 1.0000 | ORAL_TABLET | ORAL | Status: DC | PRN

## 2018-03-05 MED ORDER — ACETAMINOPHEN 325 MG PO TABS
650.0000 mg | ORAL_TABLET | ORAL | Status: DC | PRN
  Administered 2018-03-05: 650 mg via ORAL
  Filled 2018-03-05: qty 2

## 2018-03-05 MED ORDER — SOD CITRATE-CITRIC ACID 500-334 MG/5ML PO SOLN: 30.0000 mL | ORAL | Status: DC | PRN

## 2018-03-05 NOTE — MAU Note (Signed)
Dr Richardson Doppole notified of ROM meconium stained fluid and VE, admit orders given

## 2018-03-05 NOTE — MAU Note (Signed)
Pt presents with c/o ctxs that began yesterday but have increased in frequency & intensity.  Reports ctxs are 2-3 minutes apart.  Unsure if LOF.  Denies VB.  Reports no FM. Seen in MAU yesterday for labor eval.  VE "almost 2cms"

## 2018-03-05 NOTE — H&P (Signed)
Pamela Bell is a 27 y.o. female G1P0 at 40 wks and 3 days presented to MAU complaining of regular contractions every 3 minutes.. She ruptured in MAU at approximately 1 pm .. Meconium stained fluid. Cervix 3 cm in MAU.Marland Kitchen Prenatal care has been uncomplicated and has been provided by Dr. Myna Hidalgo with [facility].   . OB History    Gravida  1   Para      Term      Preterm      AB      Living        SAB      TAB      Ectopic      Multiple      Live Births             Past Medical History:  Diagnosis Date  . Anxiety   . Bilateral ovarian cysts   . Depression    takes zoloft  . False labor after 37 weeks of gestation without delivery 03/04/2018  . Irregular uterine contractions 03/04/2018  . PONV (postoperative nausea and vomiting)    Past Surgical History:  Procedure Laterality Date  . ADENOIDECTOMY    . ADENOIDECTOMY AND MYRINGOTOMY WITH TUBE PLACEMENT    . EYE SURGERY     Family History: family history includes Asthma in her father; Cancer in her paternal grandfather. Social History:  reports that she has never smoked. She has never used smokeless tobacco. She reports that she drank alcohol. She reports that she does not use drugs.     Maternal Diabetes: No Genetic Screening: Normal Maternal Ultrasounds/Referrals: Normal Fetal Ultrasounds or other Referrals:  None Maternal Substance Abuse:  No Significant Maternal Medications:  None Significant Maternal Lab Results:  Lab values include: Group B Strep negative Other Comments:  None  Review of Systems  Constitutional: Negative.   HENT: Positive for hearing loss.   Eyes: Negative.   Respiratory: Negative.   Cardiovascular: Negative.   Gastrointestinal: Negative.   Genitourinary: Negative.   Musculoskeletal: Negative.   Skin: Negative.   Neurological: Negative.   Endo/Heme/Allergies: Negative.   Psychiatric/Behavioral: Negative.    Maternal Medical History:  Reason for admission: Rupture of  membranes.   Contractions: Onset was 3-5 hours ago.   Frequency: regular.   Perceived severity is moderate.    Fetal activity: Perceived fetal activity is normal.   Last perceived fetal movement was within the past hour.    Prenatal complications: no prenatal complications Prenatal Complications - Diabetes: none.    Dilation: 4.5 Effacement (%): 90 Station: -1 Exam by:: Marcelino Duster, RN  Blood pressure 129/72, pulse 89, temperature 98.9 F (37.2 C), temperature source Oral, resp. rate 18, height 5\' 2"  (1.575 m), weight 85 kg (187 lb 8 oz), SpO2 100 %. Maternal Exam:  Uterine Assessment: Contraction frequency is regular.   Abdomen: Patient reports no abdominal tenderness. Fetal presentation: vertex  Introitus: Normal vulva. Normal vagina.  Amniotic fluid character: meconium stained.     Fetal Exam Fetal Monitor Review: Baseline rate: 135.  Variability: moderate (6-25 bpm).   Pattern: accelerations present and no decelerations.    Fetal State Assessment: Category I - tracings are normal.     Physical Exam  Constitutional: She is oriented to person, place, and time. She appears well-developed and well-nourished.  HENT:  Head: Normocephalic and atraumatic.  Eyes: Pupils are equal, round, and reactive to light. Conjunctivae are normal.  Neck: Normal range of motion. Neck supple.  Cardiovascular: Normal rate  and regular rhythm.  Respiratory: Effort normal and breath sounds normal.  GI: She exhibits no distension.  Genitourinary: Vagina normal.  Musculoskeletal: Normal range of motion. She exhibits no edema.  Neurological: She is alert and oriented to person, place, and time.  Skin: Skin is warm and dry.  Psychiatric: She has a normal mood and affect.    Cervix 4.5 cm /90/-1 by RN at 530 pm  Prenatal labs: ABO, Rh: --/--/O POS (07/09 1401) Antibody: NEG (07/09 1401) Rubella: Immune (11/06 0000) RPR: Nonreactive (11/06 0000)  HBsAg: Negative (11/06 0000)  HIV:  Non-reactive (11/06 0000)  GBS:   Negative   Assessment/Plan: 40 wks and 3 days in active labor progressing normally  GBS negative  Anticipate SVD   Epidural for pain control [facility] covering after 7pm ( midwife OdinJade [place].Marland Kitchen.Marland Kitchen.Essie HartWalda Pinn MD) Dr. Charlotta Newtonzan to assume care at 7 am. 03/06/2018    Kaylana Fenstermacher J. 03/05/2018, 6:34 PM

## 2018-03-05 NOTE — Anesthesia Preprocedure Evaluation (Addendum)
Anesthesia Evaluation    Reviewed: Allergy & Precautions, Patient's Chart, lab work & pertinent test results  History of Anesthesia Complications (+) PONV  Airway Mallampati: II  TM Distance: >3 FB Neck ROM: Full    Dental no notable dental hx. (+) Teeth Intact   Pulmonary neg pulmonary ROS,    Pulmonary exam normal breath sounds clear to auscultation       Cardiovascular negative cardio ROS Normal cardiovascular exam Rhythm:Regular Rate:Normal     Neuro/Psych Anxiety Depression negative neurological ROS     GI/Hepatic negative GI ROS, Neg liver ROS,   Endo/Other  negative endocrine ROS  Renal/GU negative Renal ROS  negative genitourinary   Musculoskeletal negative musculoskeletal ROS (+)   Abdominal   Peds  Hematology negative hematology ROS (+)   Anesthesia Other Findings   Reproductive/Obstetrics                            Anesthesia Physical Anesthesia Plan  ASA: II  Anesthesia Plan: Epidural   Post-op Pain Management:    Induction:   PONV Risk Score and Plan:   Airway Management Planned: Natural Airway  Additional Equipment: None  Intra-op Plan:   Post-operative Plan:   Informed Consent: I have reviewed the patients History and Physical, chart, labs and discussed the procedure including the risks, benefits and alternatives for the proposed anesthesia with the patient or authorized representative who has indicated his/her understanding and acceptance.     Plan Discussed with: Anesthesiologist  Anesthesia Plan Comments: (Labs reviewed. Platelets acceptable, patient not taking any blood thinning medications. Risks and benefits discussed with patient, patient expressed understanding and wished to proceed.)        Anesthesia Quick Evaluation

## 2018-03-05 NOTE — Anesthesia Procedure Notes (Signed)
Epidural Patient location during procedure: OB Start time: 03/05/2018 3:36 PM End time: 03/05/2018 3:40 PM  Staffing Anesthesiologist: Beryle LatheBrock, Candus Braud E, MD Performed: anesthesiologist   Preanesthetic Checklist Completed: patient identified, pre-op evaluation, timeout performed, IV checked, risks and benefits discussed and monitors and equipment checked  Epidural Patient position: sitting Prep: DuraPrep Patient monitoring: continuous pulse ox and blood pressure Approach: midline Location: L2-L3 Injection technique: LOR saline  Needle:  Needle type: Tuohy  Needle gauge: 17 G Needle length: 9 cm Needle insertion depth: 7 cm Catheter size: 19 Gauge Catheter at skin depth: 12 cm Test dose: negative and Other (1% lidocaine)  Additional Notes Patient identified. Risks including, but not limited to, bleeding, infection, nerve damage, paralysis, inadequate analgesia, blood pressure changes, nausea, vomiting, allergic reaction, postpartum back pain, itching, and headache were discussed. Patient expressed understanding and wished to proceed. Sterile prep and drape, including hand hygiene, mask, and sterile gloves were used. The patient was positioned and the spine was prepped. The skin was anesthetized with lidocaine. No paraesthesia or other complication noted. The patient did not experience any signs of intravascular injection such as tinnitus or metallic taste in mouth, nor signs of intrathecal spread such as rapid motor block. Please see nursing notes for vital signs. The patient tolerated the procedure well.   Leslye Peerhomas Fatemah Pourciau, MDReason for block:procedure for pain

## 2018-03-05 NOTE — Progress Notes (Signed)
Pamela Bell is a 27 y.o. G1P0 at 2238w3d by LMP admitted for PROM  Subjective: Patient comfortable with epidural. She has no complaints  Objective: BP 128/70   Pulse 100   Temp 98.9 F (37.2 C) (Oral)   Resp 20   Ht 5\' 2"  (1.575 m)   Wt 187 lb 8 oz (85 kg)   SpO2 100%   BMI 34.29 kg/m  I/O last 3 completed shifts: In: 974.7 [I.V.:974.7] Out: 450 [Urine:450] No intake/output data recorded.  FHT:  FHR: 150 bpm, variability: moderate,  accelerations:  Present,  decelerations:  Present subtle late decels nadir 125 lasting less than one minute with return to baseline UC:   regular, every 4-6 minutes SVE:   Dilation: 6 Effacement (%): 90 Station: 0 Exam by:: A. Earna Coderuttle, [facility]   Labs: Lab Results  Component Value Date   WBC 14.6 (H) 03/05/2018   HGB 12.5 03/05/2018   HCT 38.1 03/05/2018   MCV 85.8 03/05/2018   PLT 232 03/05/2018    Assessment / Plan: Augmentation of labor, progressing well  Labor: Progressing normally Preeclampsia:  no signs or symptoms of toxicity and intake and ouput balanced Fetal Wellbeing:  Category II Pain Control:  Epidural I/D:  n/a Anticipated MOD:  NSVD  Akshita Italiano STACIA 03/05/2018, 10:01 PM

## 2018-03-06 ENCOUNTER — Encounter (HOSPITAL_COMMUNITY): Payer: Self-pay | Admitting: *Deleted

## 2018-03-06 LAB — CBC
HEMATOCRIT: 32.1 % — AB (ref 36.0–46.0)
HEMOGLOBIN: 10.7 g/dL — AB (ref 12.0–15.0)
MCH: 28.4 pg (ref 26.0–34.0)
MCHC: 33.3 g/dL (ref 30.0–36.0)
MCV: 85.1 fL (ref 78.0–100.0)
Platelets: 206 10*3/uL (ref 150–400)
RBC: 3.77 MIL/uL — AB (ref 3.87–5.11)
RDW: 14.6 % (ref 11.5–15.5)
WBC: 25.2 10*3/uL — AB (ref 4.0–10.5)

## 2018-03-06 LAB — RPR: RPR Ser Ql: NONREACTIVE

## 2018-03-06 MED ORDER — ONDANSETRON HCL 4 MG/2ML IJ SOLN: 4.0000 mg | INTRAMUSCULAR | Status: DC | PRN

## 2018-03-06 MED ORDER — WITCH HAZEL-GLYCERIN EX PADS: 1.0000 "application " | MEDICATED_PAD | CUTANEOUS | Status: DC | PRN

## 2018-03-06 MED ORDER — COCONUT OIL OIL
1.0000 "application " | TOPICAL_OIL | Status: DC | PRN
  Administered 2018-03-07: 1 via TOPICAL
  Filled 2018-03-06: qty 120

## 2018-03-06 MED ORDER — SIMETHICONE 80 MG PO CHEW: 80.0000 mg | CHEWABLE_TABLET | ORAL | Status: DC | PRN

## 2018-03-06 MED ORDER — IBUPROFEN 600 MG PO TABS
600.0000 mg | ORAL_TABLET | Freq: Four times a day (QID) | ORAL | Status: DC
  Administered 2018-03-06 – 2018-03-08 (×11): 600 mg via ORAL
  Filled 2018-03-06 (×11): qty 1

## 2018-03-06 MED ORDER — ONDANSETRON HCL 4 MG PO TABS: 4.0000 mg | ORAL_TABLET | ORAL | Status: DC | PRN

## 2018-03-06 MED ORDER — SERTRALINE HCL 50 MG PO TABS
50.0000 mg | ORAL_TABLET | Freq: Every day | ORAL | Status: DC
  Administered 2018-03-06 – 2018-03-08 (×3): 50 mg via ORAL
  Filled 2018-03-06 (×4): qty 1

## 2018-03-06 MED ORDER — BENZOCAINE-MENTHOL 20-0.5 % EX AERO
1.0000 "application " | INHALATION_SPRAY | CUTANEOUS | Status: DC | PRN
  Administered 2018-03-06: 1 via TOPICAL
  Filled 2018-03-06 (×2): qty 56

## 2018-03-06 MED ORDER — TETANUS-DIPHTH-ACELL PERTUSSIS 5-2.5-18.5 LF-MCG/0.5 IM SUSP: 0.5000 mL | Freq: Once | INTRAMUSCULAR | Status: DC

## 2018-03-06 MED ORDER — ACETAMINOPHEN 325 MG PO TABS
650.0000 mg | ORAL_TABLET | ORAL | Status: DC | PRN
  Administered 2018-03-06 – 2018-03-08 (×3): 650 mg via ORAL
  Filled 2018-03-06 (×3): qty 2

## 2018-03-06 MED ORDER — DIBUCAINE 1 % RE OINT: 1.0000 "application " | TOPICAL_OINTMENT | RECTAL | Status: DC | PRN

## 2018-03-06 NOTE — Plan of Care (Signed)
  Problem: Clinical Measurements: Goal: Will remain free from infection Outcome: Progressing Goal: Diagnostic test results will improve Outcome: Progressing   Problem: Elimination: Goal: Will not experience complications related to bowel motility Outcome: Progressing   Problem: Health Behavior/Discharge Planning: Goal: Ability to manage health-related needs will improve Outcome: Completed/Met   Problem: Clinical Measurements: Goal: Ability to maintain clinical measurements within normal limits will improve Outcome: Completed/Met Goal: Cardiovascular complication will be avoided Outcome: Completed/Met   Problem: Activity: Goal: Risk for activity intolerance will decrease Outcome: Completed/Met   Problem: Nutrition: Goal: Adequate nutrition will be maintained Outcome: Completed/Met   Problem: Coping: Goal: Level of anxiety will decrease Outcome: Completed/Met   Problem: Elimination: Goal: Will not experience complications related to urinary retention Outcome: Completed/Met   Problem: Safety: Goal: Ability to remain free from injury will improve Outcome: Completed/Met   Problem: Skin Integrity: Goal: Risk for impaired skin integrity will decrease Outcome: Completed/Met   Problem: Coping: Goal: Ability to verbalize concerns and feelings about labor and delivery will improve Outcome: Completed/Met   Problem: Life Cycle: Goal: Ability to make normal progression through stages of labor will improve Outcome: Completed/Met Goal: Ability to effectively push during vaginal delivery will improve Outcome: Completed/Met   Problem: Role Relationship: Goal: Ability to demonstrate positive interaction with the child will improve Outcome: Completed/Met   Problem: Safety: Goal: Risk of complications during labor and delivery will decrease Outcome: Completed/Met

## 2018-03-06 NOTE — Lactation Note (Signed)
This note was copied from a baby's chart. Lactation Consultation Note  Patient Name: Pamela Bell WUJWJ'XToday's Date: 03/06/2018 Reason for consult: Initial assessment;Primapara;TermBreastfeeding consultation services and support information given and reviewed.  Newborn is 512 hours old and having difficulty with latch.  Mom is pumping and hand expressing and obtaining 10 mls which she spoon feeds baby.  Baby is currently sleeping in visitors arms.  Instructed to call for assist when baby wakes up.    Maternal Data Has patient been taught Hand Expression?: Yes Does the patient have breastfeeding experience prior to this delivery?: No  Feeding    LATCH Score                   Interventions    Lactation Tools Discussed/Used Pump Review: Setup, frequency, and cleaning;Milk Storage Initiated by:: RN Date initiated:: 03/06/18   Consult Status Consult Status: Follow-up Date: 03/07/18 Follow-up type: In-patient    Huston FoleyMOULDEN, Merriam Brandner S 03/06/2018, 2:09 PM

## 2018-03-06 NOTE — Clinical Social Work Note (Signed)
  MOB was referred for history of depression/anxiety. * Referral screened out by Clinical Social Worker because none of the following criteria appear to apply: ~ History of anxiety/depression during this pregnancy, or of post-partum depression. ~ Diagnosis of anxiety and/or depression within last 3 years OR * MOB's symptoms currently being treated with medication and/or therapy. Please contact the Clinical Social Worker if needs arise, or if MOB requests.  Macario GoldsJesse Britainy Kozub, KentuckyLCSW [phone]

## 2018-03-06 NOTE — Anesthesia Postprocedure Evaluation (Signed)
Anesthesia Post Note  Patient: Pamela Bell  Procedure(s) Performed: AN AD HOC LABOR EPIDURAL     Patient location during evaluation: Mother Baby Anesthesia Type: Epidural Level of consciousness: awake and alert and oriented Pain management: satisfactory to patient Vital Signs Assessment: post-procedure vital signs reviewed and stable Respiratory status: spontaneous breathing and nonlabored ventilation Cardiovascular status: stable Postop Assessment: no headache, no backache, no signs of nausea or vomiting, adequate PO intake, patient able to bend at knees and able to ambulate (patient up walking) Anesthetic complications: no    Last Vitals:  Vitals:   03/06/18 0448 03/06/18 0845  BP: 110/66 106/67  Pulse: 92 91  Resp: 18 16  Temp: 37.1 C 36.7 C  SpO2: 100% 98%    Last Pain:  Vitals:   03/06/18 0845  TempSrc: Oral  PainSc: 0-No pain   Pain Goal: Patients Stated Pain Goal: 0 (03/05/18 2230)               [name],[name]

## 2018-03-07 LAB — BIRTH TISSUE RECOVERY COLLECTION (PLACENTA DONATION)

## 2018-03-07 NOTE — Progress Notes (Signed)
Postpartum Note Day # 1  S:  Patient resting comfortable in bed.  Pain controlled.  Tolerating general diet. + flatus, no BM.  Lochia minimal.  Ambulating without difficulty.  She denies n/v/f/c, SOB, or CP.  Pt plans on breastfeeding and having some issues- has been seen by lactation.  O: Temp:  [97.9 F (36.6 C)-99.1 F (37.3 C)] 99.1 F (37.3 C) (07/11 0535) Pulse Rate:  [67-91] 67 (07/11 0535) Resp:  [16] 16 (07/11 0535) BP: (94-115)/(55-67) 94/55 (07/11 0535) SpO2:  [98 %-100 %] 99 % (07/10 1645) Gen: A&Ox3, NAD CV: RRR Resp: CTAB Abdomen: soft, NT, ND Uterus: firm, non-tender, below umbilicus Ext: No edema, no calf tenderness bilaterally  Labs:  Recent Labs    03/05/18 1401 03/06/18 0609  HGB 12.5 10.7*    A/P: Pt is a 27 y.o. G1P1001 s/p NSVD, PPD#1  - Pain well controlled -GU: voiding freely -GI: Tolerating general diet -Activity: encouraged sitting up to chair and ambulation as tolerated -Prophylaxis: early ambulation -Labs: stable as above  DISPO: Continue with routine postpartum care, plan for discharge tomorrow, PPD#2  Myna HidalgoJennifer Khamil Lamica, DO [phone] (cell) [phone] (office)

## 2018-03-07 NOTE — Lactation Note (Signed)
Lactation Consultation Note Baby 26 hrs old. Hasn't BF yet. Mom has flat nipples. W/stimulation nipples evert for short period then flattens. Mom has large nipples. Mom has #24 NS, has room in NS. #20 NS snug. Gave mom shells, mom used DEBP w/no colostrum noted. Baby will not suck on NS, nipple, finger, bottle, or pacifier. Baby chews and rolls around. Will gag at times.  W/gloved finger assessed baby's suck. Baby has NO suck swallow coordination.  Gums are ridged thick w/high small palate.  Attempted to feed w/slow flow nipple. Baby wouldn't suck. Baby chewed some, LC squeeze milk from nipple for baby to suck. Baby acts as if she is opening wanting to feed but will not suck. Will chomp and act as if pulling on nipple.  LC recommends Md assess and possibly if baby hasn't started suckling by mid day baby be evaluated by SpeechTherapy.  Patient Name: Pamela EhrichMeighan A Carlino UJWJX'BToday's Date: 03/07/2018 Reason for consult: Follow-up assessment;Mother's request;Difficult latch   Maternal Data Has patient been taught Hand Expression?: Yes Does the patient have breastfeeding experience prior to this delivery?: No  Feeding Feeding Type: Formula Nipple Type: Slow - flow Length of feed: 0 min  LATCH Score Latch: Too sleepy or reluctant, no latch achieved, no sucking elicited.  Audible Swallowing: None  Type of Nipple: Flat  Comfort (Breast/Nipple): Filling, red/small blisters or bruises, mild/mod discomfort  Hold (Positioning): Full assist, staff holds infant at breast  LATCH Score: 2  Interventions Interventions: Breast feeding basics reviewed;Support pillows;Assisted with latch;Position options;Skin to skin;Breast massage;Coconut oil;Hand express;Shells;Pre-pump if needed;Hand pump;Breast compression;DEBP;Adjust position  Lactation Tools Discussed/Used Tools: Shells;Pump;Coconut oil;Nipple Shields Nipple shield size: 20;24 Shell Type: Inverted Breast pump type: Double-Electric Breast  Pump;Manual Pump Review: Setup, frequency, and cleaning;Milk Storage Initiated by:: Peri JeffersonL. Doloras Tellado RN IBCLC Date initiated:: 03/07/18   Consult Status Consult Status: Follow-up Date: 03/07/18 Follow-up type: In-patient    Charyl DancerCARVER, Vonte Rossin G 03/07/2018, 4:17 AM

## 2018-03-08 ENCOUNTER — Inpatient Hospital Stay (HOSPITAL_COMMUNITY): Admission: RE | Admit: 2018-03-08 | Payer: Medicaid Other | Source: Ambulatory Visit

## 2018-03-08 MED ORDER — FENOPROFEN CALCIUM 200 MG PO CAPS: ORAL_CAPSULE | ORAL | 0 refills | Status: DC

## 2018-03-08 NOTE — Lactation Note (Signed)
This note was copied from a baby's chart. Lactation Consultation Note Baby 1754 hrs old. Isn't Bf well, but is starting to drink from the bottle w/much stimulation. Baby is feed fast then stop, needing stimulation to finish.  Suck training, baby tongue thrusted, wouldn't suckle on gloved finger. Took colostrum w/curve tip syring after several attempts. Held baby upright w/paced feeding. Stroking tongue w/nipple, occasionally squirting drop of formula into mouth to stimulate to suck. If baby has tight frenulum its posterior.  Mom cont. To use DEBP. [facility] request MD to order ST evaluation and plan. Patient Name: Pamela Bell ZOXWR'UToday's Date: 03/08/2018 Reason for consult: Follow-up assessment;Difficult latch   Maternal Data    Feeding Feeding Type: Formula Nipple Type: Slow - flow  LATCH Score                   Interventions Interventions: DEBP  Lactation Tools Discussed/Used Tools: Pump   Consult Status Consult Status: Follow-up Date: 03/09/18 Follow-up type: In-patient    Pamela Bell, Pamela NickelLAURA Bell 03/08/2018, 7:30 AM

## 2018-03-08 NOTE — Discharge Instructions (Signed)
Vaginal Delivery, Care After °Refer to this sheet in the next few weeks. These instructions provide you with information about caring for yourself after vaginal delivery. Your health care provider may also give you more specific instructions. Your treatment has been planned according to current medical practices, but problems sometimes occur. Call your health care provider if you have any problems or questions. °What can I expect after the procedure? °After vaginal delivery, it is common to have: °· Some bleeding from your vagina. °· Soreness in your abdomen, your vagina, and the area of skin between your vaginal opening and your anus (perineum). °· Pelvic cramps. °· Fatigue. ° °Follow these instructions at home: °Medicines °· Take over-the-counter and prescription medicines only as told by your health care provider. °· If you were prescribed an antibiotic medicine, take it as told by your health care provider. Do not stop taking the antibiotic until it is finished. °Driving ° °· Do not drive or operate heavy machinery while taking prescription pain medicine. °· Do not drive for 24 hours if you received a sedative. °Lifestyle °· Do not drink alcohol. This is especially important if you are breastfeeding or taking medicine to relieve pain. °· Do not use tobacco products, including cigarettes, chewing tobacco, or e-cigarettes. If you need help quitting, ask your health care provider. °Eating and drinking °· Drink at least 8 eight-ounce glasses of water every day unless you are told not to by your health care provider. If you choose to breastfeed your baby, you may need to drink more water than this. °· Eat high-fiber foods every day. These foods may help prevent or relieve constipation. High-fiber foods include: °? Whole grain cereals and breads. °? Brown rice. °? Beans. °? Fresh fruits and vegetables. °Activity °· Return to your normal activities as told by your health care provider. Ask your health care provider  what activities are safe for you. °· Rest as much as possible. Try to rest or take a nap when your baby is sleeping. °· Do not lift anything that is heavier than your baby or 10 lb (4.5 kg) until your health care provider says that it is safe. °· Talk with your health care provider about when you can engage in sexual activity. This may depend on your: °? Risk of infection. °? Rate of healing. °? Comfort and desire to engage in sexual activity. °Vaginal Care °· If you have an episiotomy or a vaginal tear, check the area every day for signs of infection. Check for: °? More redness, swelling, or pain. °? More fluid or blood. °? Warmth. °? Pus or a bad smell. °· Do not use tampons or douches until your health care provider says this is safe. °· Watch for any blood clots that may pass from your vagina. These may look like clumps of dark red, brown, or black discharge. °General instructions °· Keep your perineum clean and dry as told by your health care provider. °· Wear loose, comfortable clothing. °· Wipe from front to back when you use the toilet. °· Ask your health care provider if you can shower or take a bath. If you had an episiotomy or a perineal tear during labor and delivery, your health care provider may tell you not to take baths for a certain length of time. °· Wear a bra that supports your breasts and fits you well. °· If possible, have someone help you with household activities and help care for your baby for at least a few days after   you leave the hospital. °· Keep all follow-up visits for you and your baby as told by your health care provider. This is important. °Contact a health care provider if: °· You have: °? Vaginal discharge that has a bad smell. °? Difficulty urinating. °? Pain when urinating. °? A sudden increase or decrease in the frequency of your bowel movements. °? More redness, swelling, or pain around your episiotomy or vaginal tear. °? More fluid or blood coming from your episiotomy or  vaginal tear. °? Pus or a bad smell coming from your episiotomy or vaginal tear. °? A fever. °? A rash. °? Little or no interest in activities you used to enjoy. °? Questions about caring for yourself or your baby. °· Your episiotomy or vaginal tear feels warm to the touch. °· Your episiotomy or vaginal tear is separating or does not appear to be healing. °· Your breasts are painful, hard, or turn red. °· You feel unusually sad or worried. °· You feel nauseous or you vomit. °· You pass large blood clots from your vagina. If you pass a blood clot from your vagina, save it to show to your health care provider. Do not flush blood clots down the toilet without having your health care provider look at them. °· You urinate more than usual. °· You are dizzy or light-headed. °· You have not breastfed at all and you have not had a menstrual period for 12 weeks after delivery. °· You have stopped breastfeeding and you have not had a menstrual period for 12 weeks after you stopped breastfeeding. °Get help right away if: °· You have: °? Pain that does not go away or does not get better with medicine. °? Chest pain. °? Difficulty breathing. °? Blurred vision or spots in your vision. °? Thoughts about hurting yourself or your baby. °· You develop pain in your abdomen or in one of your legs. °· You develop a severe headache. °· You faint. °· You bleed from your vagina so much that you fill two sanitary pads in one hour. °This information is not intended to replace advice given to you by your health care provider. Make sure you discuss any questions you have with your health care provider. °Document Released: 08/11/2000 Document Revised: 01/26/2016 Document Reviewed: 08/29/2015 °Elsevier Interactive Patient Education © 2018 [employer] ° °

## 2018-03-08 NOTE — Progress Notes (Signed)
Pt discharged. Discharged teaching complete. Baby mad Baby patient. Pt states understanding of this.

## 2018-03-08 NOTE — Lactation Note (Signed)
This note was copied from a baby's chart. Lactation Consultation Note  Patient Name: Girl Gareth MorganMeighan Coran ZOXWR'UToday's Date: 03/08/2018 Reason for consult: Follow-up assessment;Difficult latch;Other (Comment);Primapara;1st time breastfeeding(OT therapist into see baby and mom this amd and used a Dr. Ocie BobBrown/ Premie )  Baby is 2361 hours old,  As LC entered the room mom had was working on feeding the baby with Dr. Manson PasseyBrown Nipple/ 10 ml . Mom mentioned the baby has fed the best with the Dr. Manson PasseyBrown nipple since birth.  Per mom has been pumping very 3 hours with DEBP with the last pumping 10 ml ( most volume she has pumped ). The best feedings were this am at 0728 =27 ml and 2 ml of EBM , and at 0950 - 30 ml, since then has been taking 10 ml.  LC reviewed the importance of bring up the volume to at least 30 ml or greater, also by 7 days of life at least 45 ml - 60 ml per feeding.  Reviewed Supply and demand and the importance of consistent pumping like she has been doing. LC praised mom for her efforts pumping and feeding the baby.  Sore nipple and engorgement prevention and tx reviewed. Mom will have a DEBP Medela for pumping and is aware  Its important to be consistent with her pumping. Goal to protect establishing milk supply.  LC reassured mom some babies just take time, and weight loss only 4 %. 8 wets in her life ( 6  in the last 24 hours ),  Stools in her life = 4 and per mom mec in uterus. ( last stool 7/11 at 4 am per dad ) LC documented.   LC offered to [facility] Epic for F/U feeding assessment and both mom and dad receptive for next week. LC placed a request today for the clinic to call  mom for Lc O/P by next Wednesday.  [facility] either today or Monday.   [facility] RN Odis LusterJennifer Gray and gave her update on the baby's doc flow sheets, and that the baby's last stool l was on the 7/11 at 4am.  Also when LC was at the consult baby had a good wet  diaper.   Maternal Data    Feeding Feeding Type: Formula Nipple Type: Dr. Lorne SkeensBrowns Preemie  LATCH Score                   Interventions Interventions: Breast feeding basics reviewed  Lactation Tools Discussed/Used Tools: Pump Breast pump type: Double-Electric Breast Pump   Consult Status Consult Status: Follow-up Date: ([facility] ) Follow-up type: Out-patient    Matilde SprangMargaret Ann Smith Mcnicholas 03/08/2018, 3:06 PM

## 2018-03-08 NOTE — Discharge Summary (Signed)
OB Discharge Summary     Patient Name: Pamela EhrichMeighan A Contee DOB: 01/08/1991 MRN: [id number]  Date of admission: 03/05/2018 Delivering MD: Dale DurhamMONTANA, JADE C   Date of discharge: 03/08/2018  Admitting diagnosis: LABOR Intrauterine pregnancy: 6990w4d     Secondary diagnosis:  Active Problems:   Labor and delivery indication for care or intervention  Additional problems: anxiety     Discharge diagnosis: Term Pregnancy Delivered                                                                                                Post partum procedures:n/a  Augmentation: Pitocin  Complications: None  Hospital course:  Onset of Labor With Vaginal Delivery     27 y.o. yo G1P1001 at 190w4d was admitted in Latent Labor on 03/05/2018. Patient had an uncomplicated labor course as follows:  Membrane Rupture Time/Date: 1:50 PM ,03/05/2018   Intrapartum Procedures: Episiotomy: None [1]                                         Lacerations:  1st degree [2]  Patient had a delivery of a Viable infant. 03/06/2018  Information for the patient's newborn:  Dione HousekeeperDenny, Girl Melissia [[id number]][[id number]]  Delivery Method: Vaginal, Spontaneous(Filed from Delivery Summary)    Pateint had an uncomplicated postpartum course.  She is ambulating, tolerating a regular diet, passing flatus, and urinating well. Patient is discharged home in stable condition on 03/08/18.   Physical exam  Vitals:   03/07/18 0535 03/07/18 1528 03/07/18 2137 03/08/18 0550  BP: (!) 94/55 120/78 121/74 109/73  Pulse: 67 97 83 70  Resp: 16 18 16 16   Temp: 99.1 F (37.3 C) 98.6 F (37 C) 98.8 F (37.1 C) 97.8 F (36.6 C)  TempSrc: Oral Oral Oral Oral  SpO2:      Weight:      Height:       General: alert, cooperative and no distress Lochia: appropriate Uterine Fundus: firm Incision: N/A DVT Evaluation: No evidence of DVT seen on physical exam. Labs: Lab Results  Component Value Date   WBC 25.2 (H) 03/06/2018   HGB 10.7 (L) 03/06/2018   HCT 32.1 (L)  03/06/2018   MCV 85.1 03/06/2018   PLT 206 03/06/2018   No flowsheet data found.  Discharge instruction: per After Visit Summary and "Baby and Me Booklet".  After visit meds:  Allergies as of 03/08/2018      Reactions   Bee Venom Anaphylaxis   Cranberry Anaphylaxis   Amoxicillin-pot Clavulanate    REACTION: Hives Has patient had a PCN reaction causing immediate rash, facial/tongue/throat swelling, SOB or lightheadedness with hypotension: No Has patient had a PCN reaction causing severe rash involving mucus membranes or skin necrosis: No Has patient had a PCN reaction that required hospitalization: No Has patient had a PCN reaction occurring within the last 10 years: No If all of the above answers are "NO", then may proceed with Cephalosporin use.   Latex Rash   Red Dye Hives  Medication List    TAKE these medications   acetaminophen 500 MG tablet Commonly known as:  TYLENOL Take 1,000 mg by mouth every 6 (six) hours as needed for mild pain.   Fenoprofen Calcium 200 MG Caps capsule Commonly known as:  NALFON Take 3 tablets every 6 hours as needed for pain   prenatal multivitamin Tabs tablet Take 1 tablet by mouth daily at 12 noon.   sertraline 50 MG tablet Commonly known as:  ZOLOFT Take 50 mg by mouth daily.       Diet: routine diet  Activity: Advance as tolerated. Pelvic rest for 6 weeks.   Outpatient follow up:2 weeks Follow up Appt:No future appointments. Follow up Visit:No follow-ups on file.  Postpartum contraception: Undecided  Newborn Data: Live born female  Birth Weight: 6 lb 5.2 oz (2870 g) APGAR: 8, 9  Newborn Delivery   Birth date/time:  03/06/2018 01:12:00 Delivery type:  Vaginal, Spontaneous     Baby Feeding: Bottle and Breast Disposition:home with mother   03/08/2018 Sharon Seller, DO

## 2018-03-08 NOTE — Lactation Note (Signed)
This note was copied from a baby's chart. Lactation Consultation Note LC request MD to order a Speech Consult for today. Baby cont. to have uncoordinated suck/swallow coordination. Mom isn't putting baby to breast d/t poor feeding. Baby isn't taking much formula. Please order consult.  Patient Name: Pamela [name] WGNFA'OToday's Date: 03/08/2018     Maternal Data    Feeding Feeding Type: Bottle Fed - Formula Nipple Type: Slow - flow  LATCH Score                   Interventions    Lactation Tools Discussed/Used     Consult Status      Kahla Risdon G 03/08/2018, 4:37 AM

## 2018-03-09 ENCOUNTER — Ambulatory Visit: Payer: Self-pay

## 2018-03-09 NOTE — Lactation Note (Signed)
This note was copied from a baby's chart. Lactation Consultation Note  Patient Name: Pamela Bell HYIFO'Y Date: 03/09/2018 Reason for consult: Follow-up assessment  Mom's milk is in. She is full, not engorged. The infant still mouths the bottle teat in an awkward fashion. Infant was put to the breast (with nipple shield), but infant fell asleep. I've recommended the size 24 over the size 20 nipple shield. Nfant (slow flow) nipples were also provided in case infant needed a slightly different texture/pliability/shape to bottle teat.  Mom was shown how to assemble & use hand pump (double & single-mode) that was included in pump kit. Mom also has a Medela pump at home.  Excellent tongue elevation noted. Parents' questions answered.  Matthias Hughs [facility] 03/09/2018, 9:15 AM

## 2018-03-27 ENCOUNTER — Ambulatory Visit: Payer: Medicaid Other | Admitting: Physician Assistant

## 2019-07-14 MED FILL — SERTRALINE HCL 50 MG TABLET: 50 | 90 days supply | Qty: 135 | Fill #0

## 2019-08-27 ENCOUNTER — Other Ambulatory Visit: Payer: Self-pay

## 2019-08-27 ENCOUNTER — Emergency Department (HOSPITAL_COMMUNITY)
Admission: EM | Admit: 2019-08-27 | Discharge: 2019-08-27 | Disposition: A | Discharge status: Home / Self Care | Payer: BC Managed Care – PPO | Attending: Emergency Medicine | Admitting: Emergency Medicine

## 2019-08-27 ENCOUNTER — Encounter (HOSPITAL_COMMUNITY): Payer: Self-pay

## 2019-08-27 ENCOUNTER — Emergency Department (HOSPITAL_COMMUNITY): Payer: BC Managed Care – PPO

## 2019-08-27 DIAGNOSIS — K529 Noninfective gastroenteritis and colitis, unspecified: Secondary | ICD-10-CM | POA: Diagnosis not present

## 2019-08-27 DIAGNOSIS — R1084 Generalized abdominal pain: Secondary | ICD-10-CM | POA: Diagnosis present

## 2019-08-27 LAB — CBC WITH DIFFERENTIAL/PLATELET
Abs Immature Granulocytes: 0.03 10*3/uL (ref 0.00–0.07)
Basophils Absolute: 0 10*3/uL (ref 0.0–0.1)
Basophils Relative: 0 %
Eosinophils Absolute: 0 10*3/uL (ref 0.0–0.5)
Eosinophils Relative: 0 %
HCT: 40.2 % (ref 36.0–46.0)
Hemoglobin: 12.5 g/dL (ref 12.0–15.0)
Immature Granulocytes: 0 %
Lymphocytes Relative: 15 %
Lymphs Abs: 1.4 10*3/uL (ref 0.7–4.0)
MCH: 24.3 pg — ABNORMAL LOW (ref 26.0–34.0)
MCHC: 31.1 g/dL (ref 30.0–36.0)
MCV: 78.2 fL — ABNORMAL LOW (ref 80.0–100.0)
Monocytes Absolute: 0.2 10*3/uL (ref 0.1–1.0)
Monocytes Relative: 2 %
Neutro Abs: 8.1 10*3/uL — ABNORMAL HIGH (ref 1.7–7.7)
Neutrophils Relative %: 83 %
Platelets: 423 10*3/uL — ABNORMAL HIGH (ref 150–400)
RBC: 5.14 MIL/uL — ABNORMAL HIGH (ref 3.87–5.11)
RDW: 14.8 % (ref 11.5–15.5)
WBC: 9.8 10*3/uL (ref 4.0–10.5)
nRBC: 0 % (ref 0.0–0.2)

## 2019-08-27 LAB — COMPREHENSIVE METABOLIC PANEL
ALT: 19 U/L (ref 0–44)
AST: 24 U/L (ref 15–41)
Albumin: 4.2 g/dL (ref 3.5–5.0)
Alkaline Phosphatase: 92 U/L (ref 38–126)
Anion gap: 14 (ref 5–15)
BUN: 11 mg/dL (ref 6–20)
CO2: 22 mmol/L (ref 22–32)
Calcium: 10.1 mg/dL (ref 8.9–10.3)
Chloride: 104 mmol/L (ref 98–111)
Creatinine, Ser: 0.71 mg/dL (ref 0.44–1.00)
GFR calc Af Amer: >60 mL/min (ref >60)
GFR calc non Af Amer: >60 mL/min (ref >60)
Glucose, Bld: 118 mg/dL — ABNORMAL HIGH (ref 70–99)
Potassium: 4.3 mmol/L (ref 3.5–5.1)
Sodium: 140 mmol/L (ref 135–145)
Total Bilirubin: 0.6 mg/dL (ref 0.3–1.2)
Total Protein: 8.3 g/dL — ABNORMAL HIGH (ref 6.5–8.1)

## 2019-08-27 LAB — URINALYSIS, ROUTINE W REFLEX MICROSCOPIC
Bilirubin Urine: NEGATIVE
Glucose, UA: NEGATIVE mg/dL
Hgb urine dipstick: NEGATIVE
Ketones, ur: 5 mg/dL — AB
Leukocytes,Ua: NEGATIVE
Nitrite: NEGATIVE
Protein, ur: NEGATIVE mg/dL
Specific Gravity, Urine: 1.025 (ref 1.005–1.030)
pH: 6 (ref 5.0–8.0)

## 2019-08-27 LAB — PREGNANCY, URINE: Preg Test, Ur: NEGATIVE

## 2019-08-27 LAB — LIPASE, BLOOD: Lipase: 20 U/L (ref 11–51)

## 2019-08-27 MED ORDER — IOHEXOL 300 MG/ML  SOLN
100.0000 mL | Freq: Once | INTRAMUSCULAR | Status: AC | PRN
  Administered 2019-08-27: 100 mL via INTRAVENOUS

## 2019-08-27 NOTE — ED Triage Notes (Signed)
Pt reports n/v/d on Saturday.  Had a covid test at her work and was negative.  Reports no n/v/d since Saturday but started having generalized abd pain at 9pm last night.  Denies urinary symptoms.  LMB was last night but was very small.  Denies any urinary symptoms.  Denies abnormal vaginal bleeding or discharge.  LMP was dec 23.

## 2019-08-27 NOTE — Discharge Instructions (Addendum)
Return for any new or worse symptoms.  Recommend bland food sort of the brat diet for the next several days.  Work note provided.  Return for new or worse symptoms to include vomiting or diarrhea.  Or blood in the bowel movements.  CT scan showed evidence of inflammation of the small intestines probably residual from the nausea vomiting and diarrhea illness that she had over the weekend.  Otherwise no acute findings.  Labs were normal.  Follow-up with your regular doctor if not improved after the weekend.

## 2019-08-27 NOTE — ED Notes (Signed)
Pt in ct 

## 2019-08-27 NOTE — ED Provider Notes (Signed)
[facility] Provider Note   CSN: [id number] Arrival date & time: 08/27/19  0707     History Chief Complaint  Patient presents with  . Abdominal Pain    Lindee A Katherina RightDenny is a 28 y.o. female.  Patient with a complaint of lower quadrant and upper quadrant abdominal pain started about 9 PM last night feels as if she is getting punched in the stomach.  Over the weekend patient had nausea vomiting and diarrhea.  Patient had on Sunday a flu testing Covid test that was negative.  Patient without fevers or any upper respiratory symptoms.  Currently no nausea vomiting and the diarrhea has resolved.        Past Medical History:  Diagnosis Date  . Anxiety   . Bilateral ovarian cysts   . Depression    takes zoloft  . False labor after 37 weeks of gestation without delivery 03/04/2018  . Irregular uterine contractions 03/04/2018  . PONV (postoperative nausea and vomiting)     Patient Active Problem List   Diagnosis Date Noted  . Labor and delivery indication for care or intervention 03/05/2018  . False labor after 37 weeks of gestation without delivery 03/04/2018  . Irregular uterine contractions 03/04/2018  . Abnormal EKG 02/10/2018  . ACUTE SEROUS OTITIS MEDIA 01/13/2009    Past Surgical History:  Procedure Laterality Date  . ADENOIDECTOMY    . ADENOIDECTOMY AND MYRINGOTOMY WITH TUBE PLACEMENT    . EYE SURGERY       OB History    Gravida  1   Para  1   Term  1   Preterm      AB      Living  1     SAB      TAB      Ectopic      Multiple  0   Live Births  1           Family History  Problem Relation Age of Onset  . Asthma Father   . Cancer Paternal Grandfather   . ADD / ADHD Neg Hx   . Alcohol abuse Neg Hx   . Anxiety disorder Neg Hx   . Arthritis Neg Hx   . Birth defects Neg Hx   . COPD Neg Hx   . Depression Neg Hx   . Diabetes Neg Hx   . Early death Neg Hx   . Drug abuse Neg Hx   . Hearing loss Neg Hx   . Heart disease  Neg Hx   . Hyperlipidemia Neg Hx   . Intellectual disability Neg Hx   . Hypertension Neg Hx   . Kidney disease Neg Hx   . Learning disabilities Neg Hx   . Miscarriages / Stillbirths Neg Hx   . Obesity Neg Hx   . Stroke Neg Hx   . Vision loss Neg Hx   . Varicose Veins Neg Hx     Social History   Tobacco Use  . Smoking status: Never Smoker  . Smokeless tobacco: Never Used  Substance Use Topics  . Alcohol use: Not Currently  . Drug use: Never    Home Medications Prior to Admission medications   Medication Sig Start Date End Date Taking? Authorizing Provider  acetaminophen (TYLENOL) 500 MG tablet Take 1,000 mg by mouth every 6 (six) hours as needed for mild pain.    [provider]  Fenoprofen Calcium (NALFON) 200 MG CAPS capsule Take 3 tablets every 6 hours as  needed for pain 03/08/18   Myna Hidalgo, DO  Prenatal Vit-Fe Fumarate-FA (PRENATAL MULTIVITAMIN) TABS tablet Take 1 tablet by mouth daily at 12 noon.    [provider]  sertraline (ZOLOFT) 50 MG tablet Take 50 mg by mouth daily.    [provider]    Allergies    Bee venom, Cranberry, Amoxicillin-pot clavulanate, Latex, and Red dye  Review of Systems   Review of Systems  Constitutional: Negative for chills and fever.  HENT: Negative for congestion, rhinorrhea and sore throat.   Eyes: Negative for visual disturbance.  Respiratory: Negative for cough and shortness of breath.   Cardiovascular: Negative for chest pain and leg swelling.  Gastrointestinal: Positive for abdominal pain, diarrhea, nausea and vomiting. Negative for blood in stool.  Genitourinary: Negative for dysuria, hematuria, vaginal bleeding and vaginal discharge.  Musculoskeletal: Negative for back pain and neck pain.  Skin: Negative for rash.  Neurological: Negative for dizziness, light-headedness and headaches.  Hematological: Does not bruise/bleed easily.  Psychiatric/Behavioral: Negative for confusion.    Physical  Exam Updated Vital Signs BP 132/86 (BP Location: Right Arm)   Pulse 92   Temp 97.7 F (36.5 C) (Oral)   Resp 18   Ht 1.575 m (5\' 2" )   Wt 83.9 kg   LMP 08/20/2019 Comment: pt has IUD  SpO2 99%   Breastfeeding No   BMI 33.84 kg/m   Physical Exam Vitals and nursing note reviewed.  Constitutional:      General: She is not in acute distress.    Appearance: Normal appearance. She is well-developed.  HENT:     Head: Normocephalic and atraumatic.  Eyes:     Conjunctiva/sclera: Conjunctivae normal.     Pupils: Pupils are equal, round, and reactive to light.  Cardiovascular:     Rate and Rhythm: Normal rate and regular rhythm.     Heart sounds: No murmur.  Pulmonary:     Effort: Pulmonary effort is normal. No respiratory distress.     Breath sounds: Normal breath sounds.  Abdominal:     General: There is no distension.     Palpations: Abdomen is soft. There is no mass.     Tenderness: There is no abdominal tenderness. There is no guarding or rebound.  Musculoskeletal:        General: Normal range of motion.     Cervical back: Normal range of motion and neck supple.  Skin:    General: Skin is warm and dry.     Capillary Refill: Capillary refill takes less than 2 seconds.  Neurological:     General: No focal deficit present.     Mental Status: She is alert and oriented to person, place, and time.     ED Results / Procedures / Treatments   Labs (all labs ordered are listed, but only abnormal results are displayed) Labs Reviewed  CBC WITH DIFFERENTIAL/PLATELET - Abnormal; Notable for the following components:      Result Value   RBC 5.14 (*)    MCV 78.2 (*)    MCH 24.3 (*)    Platelets 423 (*)    Neutro Abs 8.1 (*)    All other components within normal limits  COMPREHENSIVE METABOLIC PANEL - Abnormal; Notable for the following components:   Glucose, Bld 118 (*)    Total Protein 8.3 (*)    All other components within normal limits  URINALYSIS, ROUTINE W REFLEX  MICROSCOPIC - Abnormal; Notable for the following components:   Ketones, ur 5 (*)  All other components within normal limits  PREGNANCY, URINE  LIPASE, BLOOD    EKG None  Radiology CT Abdomen Pelvis W Contrast  Result Date: 08/27/2019 CLINICAL DATA:  Abdominal pain, primarily right-sided EXAM: CT ABDOMEN AND PELVIS WITH CONTRAST TECHNIQUE: Multidetector CT imaging of the abdomen and pelvis was performed using the standard protocol following bolus administration of intravenous contrast. CONTRAST:  OMNIPAQUE IOHEXOL 300 MG/ML  SOLN COMPARISON:  None. FINDINGS: Lower chest: There is mild atelectatic change in the anterior right base. No lung base edema or consolidation. Hepatobiliary: No focal liver lesions are evident. Gallbladder wall is not appreciably thickened. There is no biliary duct dilatation. Pancreas: There is no pancreatic mass or inflammatory focus. Spleen: No splenic lesions are evident. Adrenals/Urinary Tract: Adrenals bilaterally appear normal. Kidneys bilaterally show no evident mass or hydronephrosis on either side. There is no evident renal or ureteral calculus on either side. Urinary bladder is midline with wall thickness within normal limits. Stomach/Bowel: There is fluid throughout most of the small bowel. There is mild wall thickening involving multiple loops of jejunum with slight enhancement the walls of multiple loops of jejunum. There is no mesenteric thickening. There is no evident bowel obstruction. The terminal ileum appears normal. There is no evident free air or portal venous air. Vascular/Lymphatic: There is no abdominal aortic aneurysm. No vascular lesions are evident. Major venous structures appear patent. There is no adenopathy in the abdomen or pelvis. Reproductive: Uterus is anteverted. There is an intrauterine device positioned within the endometrium. There is no pelvic mass evident. Other: The appendix appears normal. There is no abscess or ascites in the  abdomen or pelvis. There is slight fat in the umbilicus. Musculoskeletal: No blastic or lytic bone lesions. No intramuscular lesions are evident. IMPRESSION: 1. Findings consistent with a degree of enteritis. No bowel obstruction. Appendix appears normal. No abscess in the abdomen or pelvis. 2. No renal or ureteral calculus. No hydronephrosis. Urinary bladder wall thickness normal. 3.  Intrauterine device positioned within the endometrium. Electronically Signed   By: Bretta Bang III M.D.   On: 08/27/2019 08:30    Procedures Procedures (including critical care time)  Medications Ordered in ED Medications  iohexol (OMNIPAQUE) 300 MG/ML solution 100 mL (100 mLs Intravenous Contrast Given 08/27/19 0816)    ED Course  I have reviewed the triage vital signs and the nursing notes.  Pertinent labs & imaging results that were available during my care of the patient were reviewed by me and considered in my medical decision making (see chart for details).    MDM Rules/Calculators/A&P                     Patient CBC urinalysis and pregnancy test without any acute findings.  Electrolytes still pending.  Will get CT scan abdomen and pelvis with contrast to further evaluate the lower quadrant and upper quadrant abdominal pain.  Since generalized pain at the onset of the pain being at 9 PM last night appendicitis not completely ruled out.  CT scan consistent with enteritis.  Probably residual from the nausea vomiting and diarrheal illness over the weekend.  Possibly could have been food poisoning.  Also patient's husband with similar illness so that does not rule out the either viral gastroenteritis or food poisoning.  No other acute findings.  Reproductive organs normal.  Labs without any abnormality.  Complete metabolic panel and lipase normal.  Will treat symptomatically Tylenol as needed and then a bland diet.  Follow-up  with primary care doctor if not improving over the next several days returning  for any new or worse symptoms.  Work note provided.     Final Clinical Impression(s) / ED Diagnoses Final diagnoses:  Enteritis    Rx / DC Orders ED Discharge Orders    None       Fredia Sorrow, MD 08/27/19 [phone]

## 2020-05-16 IMAGING — CT CT ABD-PELV W/ CM
2 of 4 series · 15 of 46 positions shown, 17 images · IV contrast (omnipaque)
Comparison: None.

CLINICAL DATA: Abdominal pain, primarily right-sided

EXAM:
CT ABDOMEN AND PELVIS WITH CONTRAST
TECHNIQUE: Multidetector CT imaging of the abdomen and pelvis was performed
using the standard protocol following bolus administration of
intravenous contrast.
CONTRAST:  100mL OMNIPAQUE IOHEXOL 300 MG/ML  SOLN

[Series 3: axial st · axial · 0.72mm/px · z∈[-417,-2]mm · 12 of 93 slices shown, 14 images]
[im 5/93  soft-tissue]
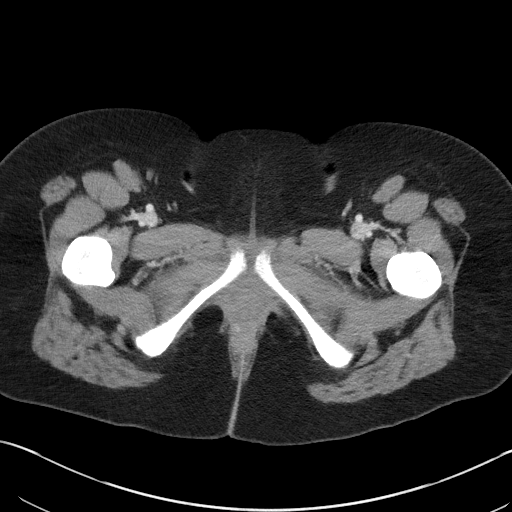
[im 5/93  bone]
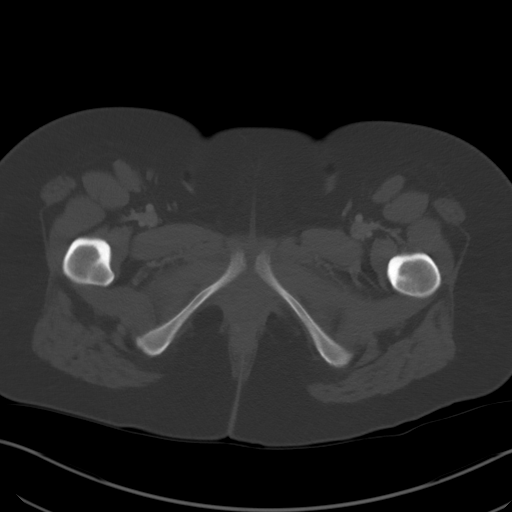
[im 14/93  soft-tissue]
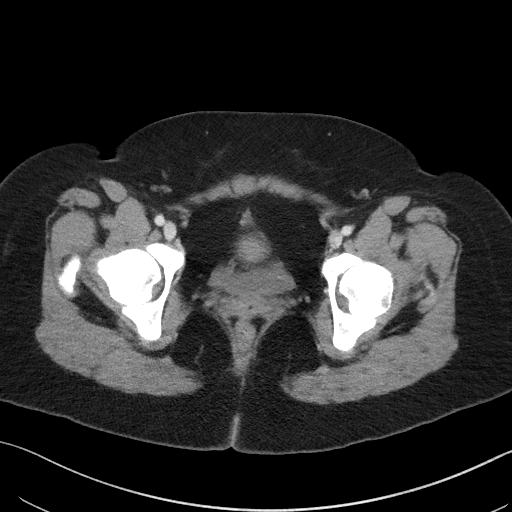
[im 19/93  soft-tissue]
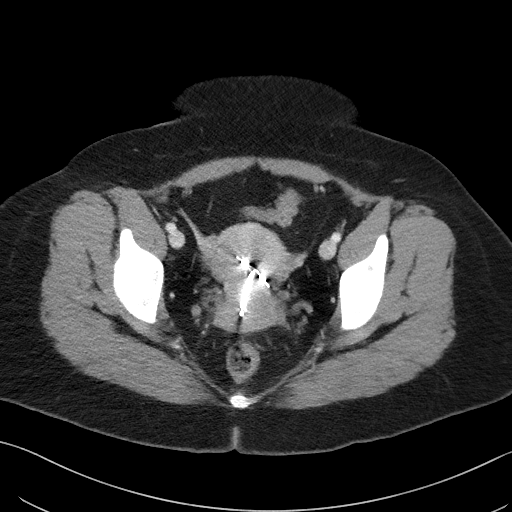
[im 28/93  soft-tissue]
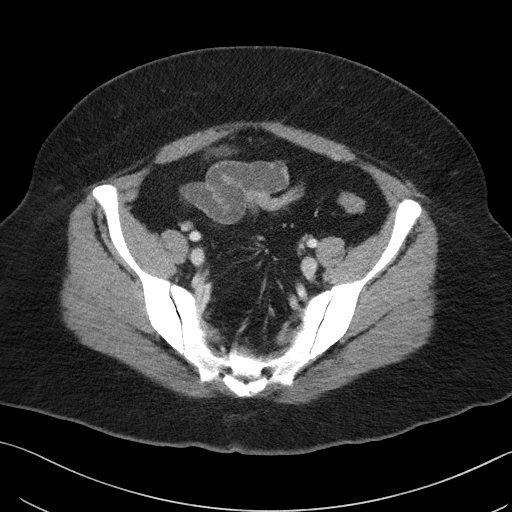
[im 37/93  soft-tissue]
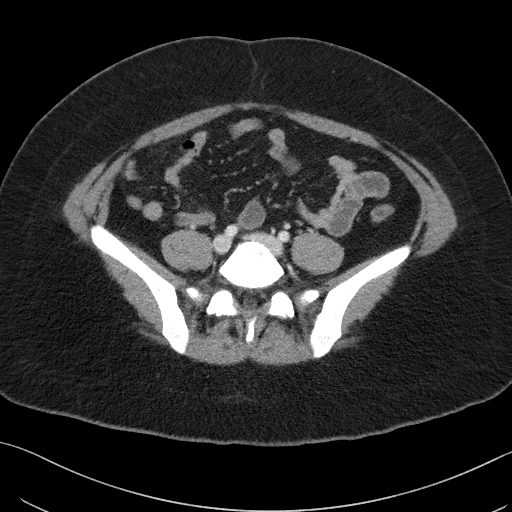
[im 42/93  soft-tissue]
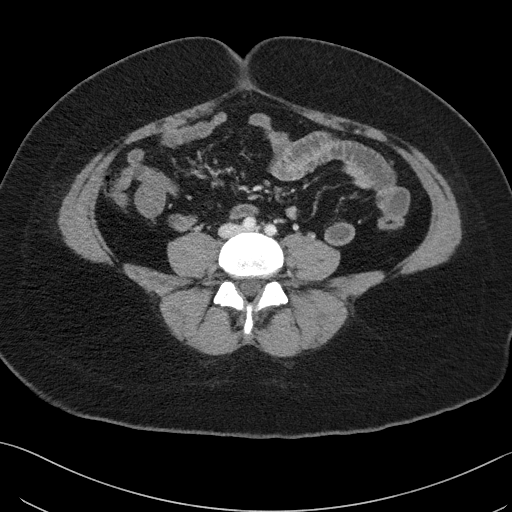
[im 51/93  soft-tissue]
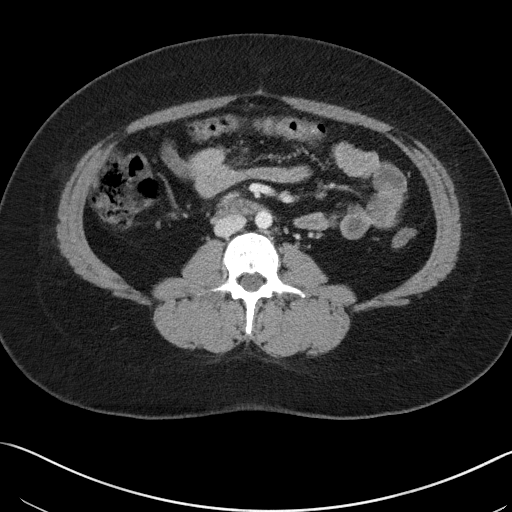
[im 56/93  soft-tissue]
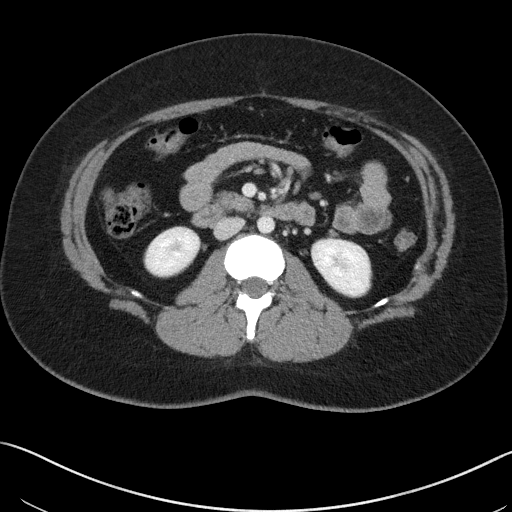
[im 65/93  soft-tissue]
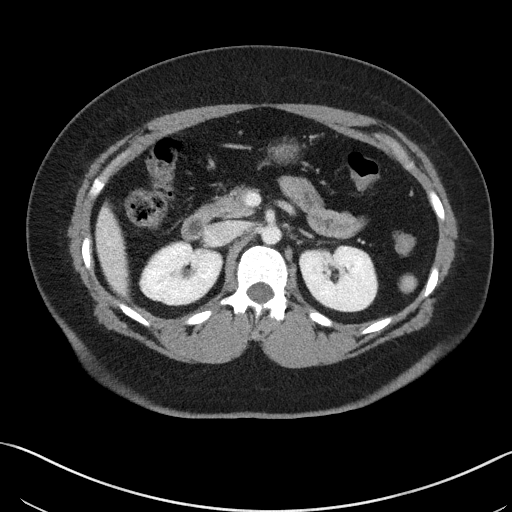
[im 65/93  bone]
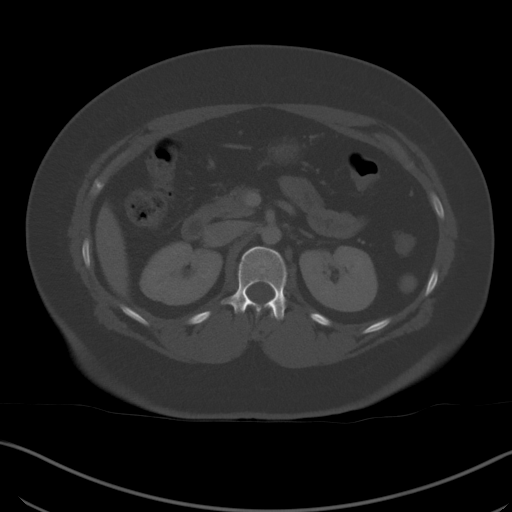
[im 74/93  soft-tissue]
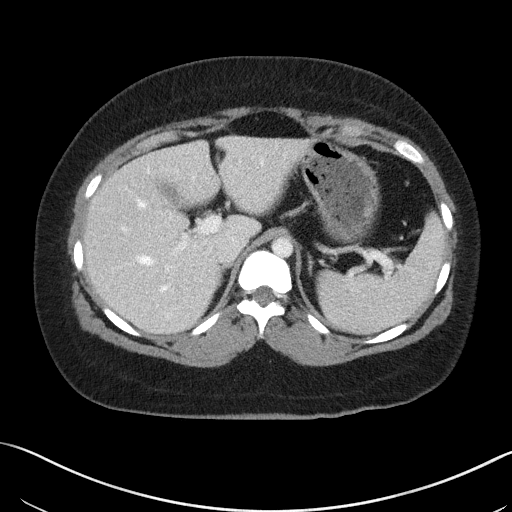
[im 79/93  soft-tissue]
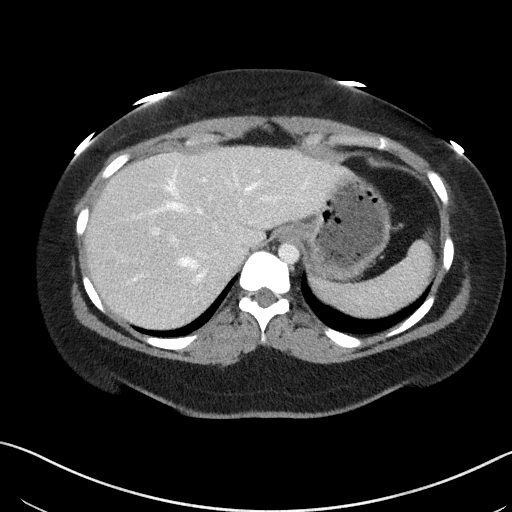
[im 88/93  soft-tissue]
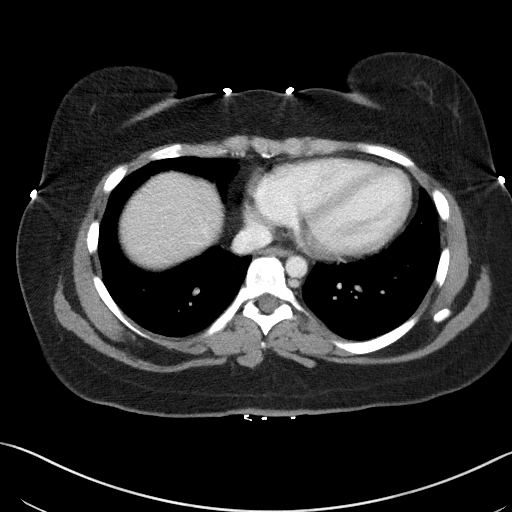

[Series 6: coronal st · coronal · 0.69mm/px · 3 of 92 slices shown]
[im 31/92  soft-tissue]
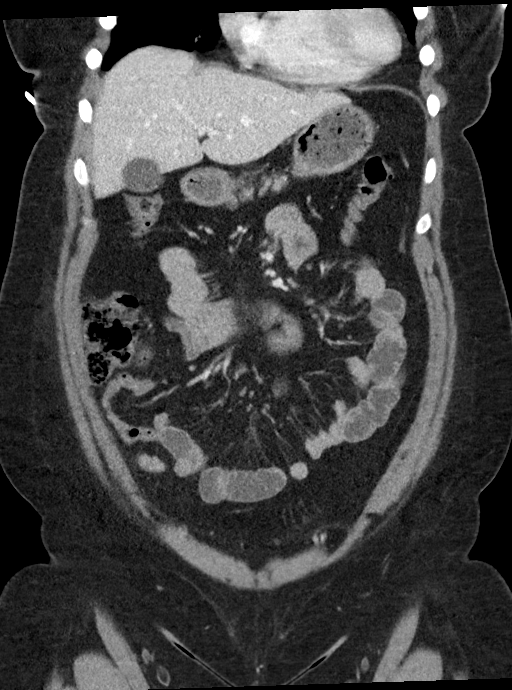
[im 41/92  soft-tissue]
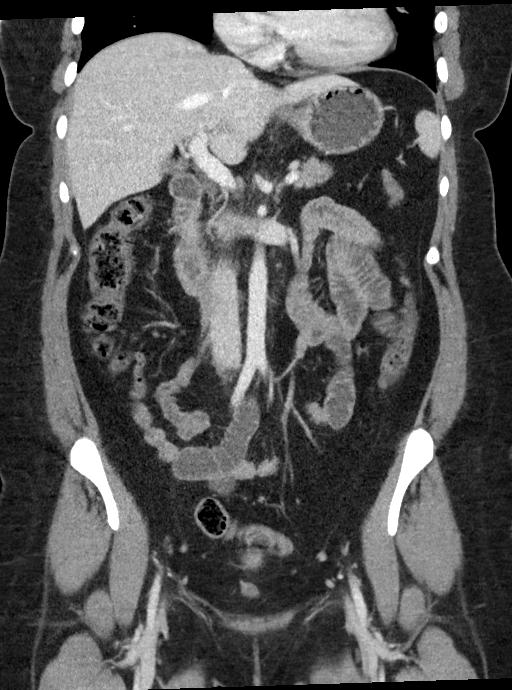
[im 51/92  soft-tissue]
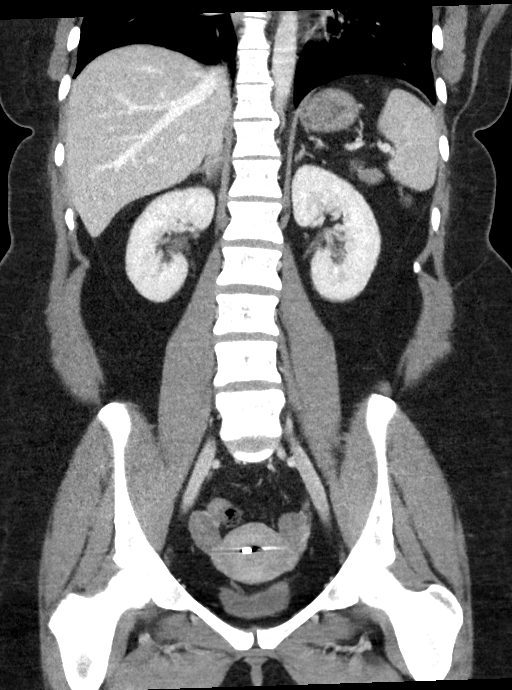

[15 of 46 positions shown; findings below may reference images not displayed]

FINDINGS: Lower chest: There is mild atelectatic change in the anterior right
base. No lung base edema or consolidation.

Hepatobiliary: No focal liver lesions are evident. Gallbladder wall
is not appreciably thickened. There is no biliary duct dilatation.

Pancreas: There is no pancreatic mass or inflammatory focus.

Spleen: No splenic lesions are evident.

Adrenals/Urinary Tract: Adrenals bilaterally appear normal. Kidneys
bilaterally show no evident mass or hydronephrosis on either side.
There is no evident renal or ureteral calculus on either side.
Urinary bladder is midline with wall thickness within normal limits.

Stomach/Bowel: There is fluid throughout most of the small bowel.
There is mild wall thickening involving multiple loops of jejunum
with slight enhancement the walls of multiple loops of jejunum.
There is no mesenteric thickening. There is no evident bowel
obstruction. The terminal ileum appears normal. There is no evident
free air or portal venous air.

Vascular/Lymphatic: There is no abdominal aortic aneurysm. No
vascular lesions are evident. Major venous structures appear patent.
There is no adenopathy in the abdomen or pelvis.

Reproductive: Uterus is anteverted. There is an intrauterine device
positioned within the endometrium. There is no pelvic mass evident.

Other: The appendix appears normal. There is no abscess or ascites
in the abdomen or pelvis. There is slight fat in the umbilicus.

Musculoskeletal: No blastic or lytic bone lesions. No intramuscular
lesions are evident.
IMPRESSION: 1. Findings consistent with a degree of enteritis. No bowel
obstruction. Appendix appears normal. No abscess in the abdomen or
pelvis.

2. No renal or ureteral calculus. No hydronephrosis. Urinary bladder
wall thickness normal.

3.  Intrauterine device positioned within the endometrium.

## 2020-05-20 ENCOUNTER — Telehealth: Payer: Self-pay

## 2020-05-20 NOTE — Telephone Encounter (Signed)
error 

## 2021-04-12 ENCOUNTER — Emergency Department (HOSPITAL_BASED_OUTPATIENT_CLINIC_OR_DEPARTMENT_OTHER): Payer: 59

## 2021-04-12 ENCOUNTER — Emergency Department (HOSPITAL_BASED_OUTPATIENT_CLINIC_OR_DEPARTMENT_OTHER)
Admission: EM | Admit: 2021-04-12 | Discharge: 2021-04-13 | Disposition: A | Discharge status: Home / Self Care | Payer: 59 | Attending: Emergency Medicine | Admitting: Emergency Medicine

## 2021-04-12 ENCOUNTER — Other Ambulatory Visit: Payer: Self-pay

## 2021-04-12 ENCOUNTER — Encounter (HOSPITAL_BASED_OUTPATIENT_CLINIC_OR_DEPARTMENT_OTHER): Payer: Self-pay | Admitting: *Deleted

## 2021-04-12 DIAGNOSIS — Z91040 Latex allergy status: Secondary | ICD-10-CM | POA: Diagnosis not present

## 2021-04-12 DIAGNOSIS — R002 Palpitations: Secondary | ICD-10-CM | POA: Diagnosis not present

## 2021-04-12 DIAGNOSIS — R0789 Other chest pain: Secondary | ICD-10-CM | POA: Insufficient documentation

## 2021-04-12 DIAGNOSIS — R0602 Shortness of breath: Secondary | ICD-10-CM | POA: Insufficient documentation

## 2021-04-12 LAB — CBC
HCT: 39.1 % (ref 36.0–46.0)
Hemoglobin: 12.9 g/dL (ref 12.0–15.0)
MCH: 27.1 pg (ref 26.0–34.0)
MCHC: 33 g/dL (ref 30.0–36.0)
MCV: 82.1 fL (ref 80.0–100.0)
Platelets: 312 10*3/uL (ref 150–400)
RBC: 4.76 MIL/uL (ref 3.87–5.11)
RDW: 13.1 % (ref 11.5–15.5)
WBC: 13.4 10*3/uL — ABNORMAL HIGH (ref 4.0–10.5)
nRBC: 0 % (ref 0.0–0.2)

## 2021-04-12 LAB — TROPONIN I (HIGH SENSITIVITY): Troponin I (High Sensitivity): <2 ng/L (ref <18)

## 2021-04-12 LAB — BASIC METABOLIC PANEL
Anion gap: 8 (ref 5–15)
BUN: 12 mg/dL (ref 6–20)
CO2: 28 mmol/L (ref 22–32)
Calcium: 9.3 mg/dL (ref 8.9–10.3)
Chloride: 100 mmol/L (ref 98–111)
Creatinine, Ser: 0.85 mg/dL (ref 0.44–1.00)
GFR, Estimated: >60 mL/min (ref >60)
Glucose, Bld: 136 mg/dL — ABNORMAL HIGH (ref 70–99)
Potassium: 3.7 mmol/L (ref 3.5–5.1)
Sodium: 136 mmol/L (ref 135–145)

## 2021-04-12 LAB — D-DIMER, QUANTITATIVE: D-Dimer, Quant: <0.27 ug/mL-FEU (ref 0.00–0.50)

## 2021-04-12 MED ORDER — KETOROLAC TROMETHAMINE 15 MG/ML IJ SOLN
15.0000 mg | Freq: Once | INTRAMUSCULAR | Status: AC
  Administered 2021-04-12: 15 mg via INTRAVENOUS
  Filled 2021-04-12: qty 1

## 2021-04-12 NOTE — ED Triage Notes (Signed)
C/o  mid sternal chest pain with SOb x 2 hrs ago , also c/o increased bp 150/90

## 2021-04-13 LAB — TROPONIN I (HIGH SENSITIVITY): Troponin I (High Sensitivity): <2 ng/L (ref <18)

## 2021-04-13 NOTE — ED Notes (Signed)
Discharge instructions discussed with pt. Pt verbalized understanding. Pt stable and ambulatory.  °

## 2021-04-13 NOTE — Discharge Instructions (Addendum)
You were seen today for chest pain.  Your work-up was reassuring including heart testing and screening for blood clots.  Follow-up with your primary physician.

## 2021-04-13 NOTE — ED Provider Notes (Signed)
[facility] EMERGENCY DEPARTMENT Provider Note   CSN: [id number] Arrival date & time: 04/12/21  2135     History Chief Complaint  Patient presents with   Chest Pain    Pamela Bell is a 30 y.o. female.  HPI     This a 29 year old female with a history of anxiety who presents with palpitations and chest discomfort.  Patient reports onset of symptoms several hours prior to arrival.  She reports that she was just sitting down when she noted palpitations and increasing her heart rate.  She had associated chest discomfort and shortness of breath.  She noted her blood pressure was increased to 150/90 and this concerned her.  She states that she is not doing anything exertional.  She did not feel particularly anxious although she does have a history of anxiety attacks.  She denies any recent fevers or cough.  No history of thyroid disease.  Currently she states she feels mostly better.  She she reports occasional palpitation but no ongoing chest discomfort.  Denies leg swelling.  No hormonal birth control.  Patient denies alcohol or illicit drug use  Past Medical History:  Diagnosis Date   Anxiety    Bilateral ovarian cysts    Depression    takes zoloft   False labor after 37 weeks of gestation without delivery 03/04/2018   Irregular uterine contractions 03/04/2018   PONV (postoperative nausea and vomiting)     Patient Active Problem List   Diagnosis Date Noted   Labor and delivery indication for care or intervention 03/05/2018   False labor after 37 weeks of gestation without delivery 03/04/2018   Irregular uterine contractions 03/04/2018   Abnormal EKG 02/10/2018   ACUTE SEROUS OTITIS MEDIA 01/13/2009    Past Surgical History:  Procedure Laterality Date   ADENOIDECTOMY     ADENOIDECTOMY AND MYRINGOTOMY WITH TUBE PLACEMENT     EYE SURGERY       OB History     Gravida  1   Para  1   Term  1   Preterm      AB      Living  1      SAB      IAB       Ectopic      Multiple  0   Live Births  1           Family History  Problem Relation Age of Onset   Asthma Father    Cancer Paternal Grandfather    ADD / ADHD Neg Hx    Alcohol abuse Neg Hx    Anxiety disorder Neg Hx    Arthritis Neg Hx    Birth defects Neg Hx    COPD Neg Hx    Depression Neg Hx    Diabetes Neg Hx    Early death Neg Hx    Drug abuse Neg Hx    Hearing loss Neg Hx    Heart disease Neg Hx    Hyperlipidemia Neg Hx    Intellectual disability Neg Hx    Hypertension Neg Hx    Kidney disease Neg Hx    Learning disabilities Neg Hx    Miscarriages / Stillbirths Neg Hx    Obesity Neg Hx    Stroke Neg Hx    Vision loss Neg Hx    Varicose Veins Neg Hx     Social History   Tobacco Use   Smoking status: Never   Smokeless tobacco: Never  Substance Use Topics   Alcohol use: Not Currently   Drug use: Never    Home Medications Prior to Admission medications   Medication Sig Start Date End Date Taking? Authorizing Provider  acetaminophen (TYLENOL) 500 MG tablet Take 1,000 mg by mouth every 6 (six) hours as needed for mild pain.    [provider]  Fenoprofen Calcium (NALFON) 200 MG CAPS capsule Take 3 tablets every 6 hours as needed for pain 03/08/18   Myna Hidalgo, DO  Prenatal Vit-Fe Fumarate-FA (PRENATAL MULTIVITAMIN) TABS tablet Take 1 tablet by mouth daily at 12 noon.    [provider]  sertraline (ZOLOFT) 50 MG tablet Take 50 mg by mouth daily.    [provider]    Allergies    Bee venom, Cranberry, Amoxicillin-pot clavulanate, Latex, and Red dye  Review of Systems   Review of Systems  Constitutional:  Negative for fever.  Respiratory:  Positive for shortness of breath. Negative for cough.   Cardiovascular:  Positive for chest pain and palpitations. Negative for leg swelling.  Gastrointestinal:  Negative for abdominal pain, nausea and vomiting.  All other systems reviewed and are negative.  Physical Exam Updated  Vital Signs BP (!) 144/92 (BP Location: Left Arm)   Pulse 92   Temp 97.8 F (36.6 C) (Oral)   Resp 18   Ht 1.575 m (5\' 2" )   Wt 88.5 kg   LMP 03/18/2021   SpO2 96%   BMI 35.67 kg/m   Physical Exam Vitals and nursing note reviewed.  Constitutional:      Appearance: She is well-developed. She is not ill-appearing.  HENT:     Head: Normocephalic and atraumatic.  Eyes:     Pupils: Pupils are equal, round, and reactive to light.  Cardiovascular:     Rate and Rhythm: Normal rate and regular rhythm.     Heart sounds: Normal heart sounds.  Pulmonary:     Effort: Pulmonary effort is normal. No respiratory distress.     Breath sounds: No wheezing.  Abdominal:     General: Bowel sounds are normal.     Palpations: Abdomen is soft.  Musculoskeletal:     Cervical back: Neck supple.     Right lower leg: No tenderness. No edema.     Left lower leg: No tenderness. No edema.  Skin:    General: Skin is warm and dry.  Neurological:     Mental Status: She is alert and oriented to person, place, and time.  Psychiatric:        Mood and Affect: Mood normal.    ED Results / Procedures / Treatments   Labs (all labs ordered are listed, but only abnormal results are displayed) Labs Reviewed  BASIC METABOLIC PANEL - Abnormal; Notable for the following components:      Result Value   Glucose, Bld 136 (*)    All other components within normal limits  CBC - Abnormal; Notable for the following components:   WBC 13.4 (*)    All other components within normal limits  D-DIMER, QUANTITATIVE  TROPONIN I (HIGH SENSITIVITY)  TROPONIN I (HIGH SENSITIVITY)    EKG EKG Interpretation  Date/Time:  Tuesday April 12 2021 21:49:39 EDT Ventricular Rate:  96 PR Interval:  160 QRS Duration: 72 QT Interval:  346 QTC Calculation: 437 R Axis:   80 Text Interpretation: Normal sinus rhythm Septal infarct , age undetermined Abnormal ECG Confirmed by 04-07-1979 (Ross Marcus) on 04/12/2021 11:19:30  PM  Radiology DG Chest  2 View  Result Date: 04/12/2021 CLINICAL DATA:  Chest pain and shortness of breath. EXAM: CHEST - 2 VIEW COMPARISON:  Chest x-ray 05/03/2020 FINDINGS: The heart size and mediastinal contours are within normal limits. No focal consolidation. No pulmonary edema. No pleural effusion. No pneumothorax. No acute osseous abnormality. IMPRESSION: No active cardiopulmonary disease. Electronically Signed   By: Tish Frederickson M.D.   On: 04/12/2021 22:10    Procedures Procedures   Medications Ordered in ED Medications  ketorolac (TORADOL) 15 MG/ML injection 15 mg (15 mg Intravenous Given 04/12/21 2348)    ED Course  I have reviewed the triage vital signs and the nursing notes.  Pertinent labs & imaging results that were available during my care of the patient were reviewed by me and considered in my medical decision making (see chart for details).    MDM Rules/Calculators/A&P                           Patient presents with chest discomfort.  She is overall nontoxic and vital signs are reassuring.  Blood pressure is slightly elevated at 144/92.  EKG shows no evidence of acute ischemic or arrhythmic changes.  Low risk for ACS.  Troponin x2 negative.  Chest x-ray shows no evidence of pneumothorax or pneumonia.  Denies infectious symptoms.  Screening D-dimer was sent for PE and was negative.  Overall work-up reassuring.  Doubt acute emergent process.  Recommend follow-up with PCP.  After history, exam, and medical workup I feel the patient has been appropriately medically screened and is safe for discharge home. Pertinent diagnoses were discussed with the patient. Patient was given return precautions.  Final Clinical Impression(s) / ED Diagnoses Final diagnoses:  Atypical chest pain    Rx / DC Orders ED Discharge Orders     None        Tippi Mccrae, Mayer Masker, MD 04/13/21 [phone]

## 2021-12-31 IMAGING — CR DG CHEST 2V
2 series · 2 of 2 positions shown · non-contrast
Comparison: Chest x-ray 05/03/2020

CLINICAL DATA: Chest pain and shortness of breath.

EXAM:
CHEST - 2 VIEW

[w chest pa]
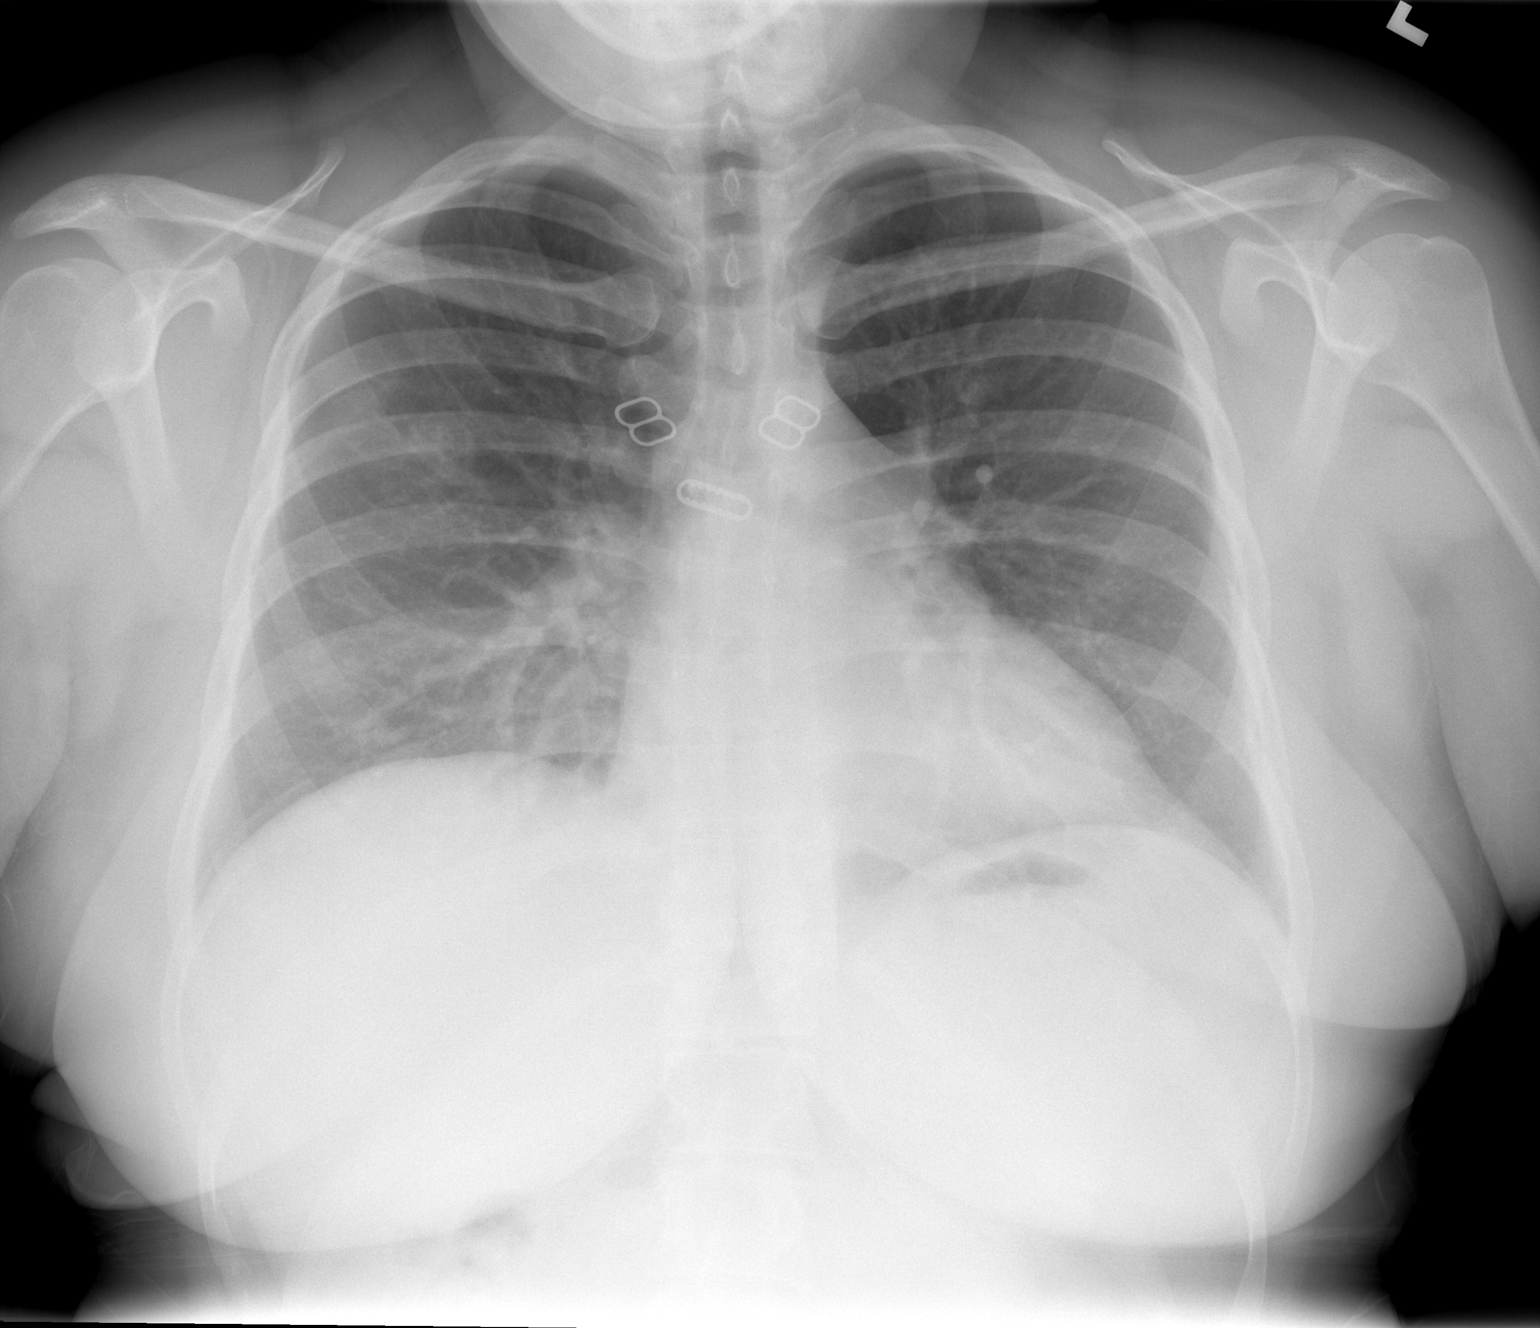

[w chest lat]
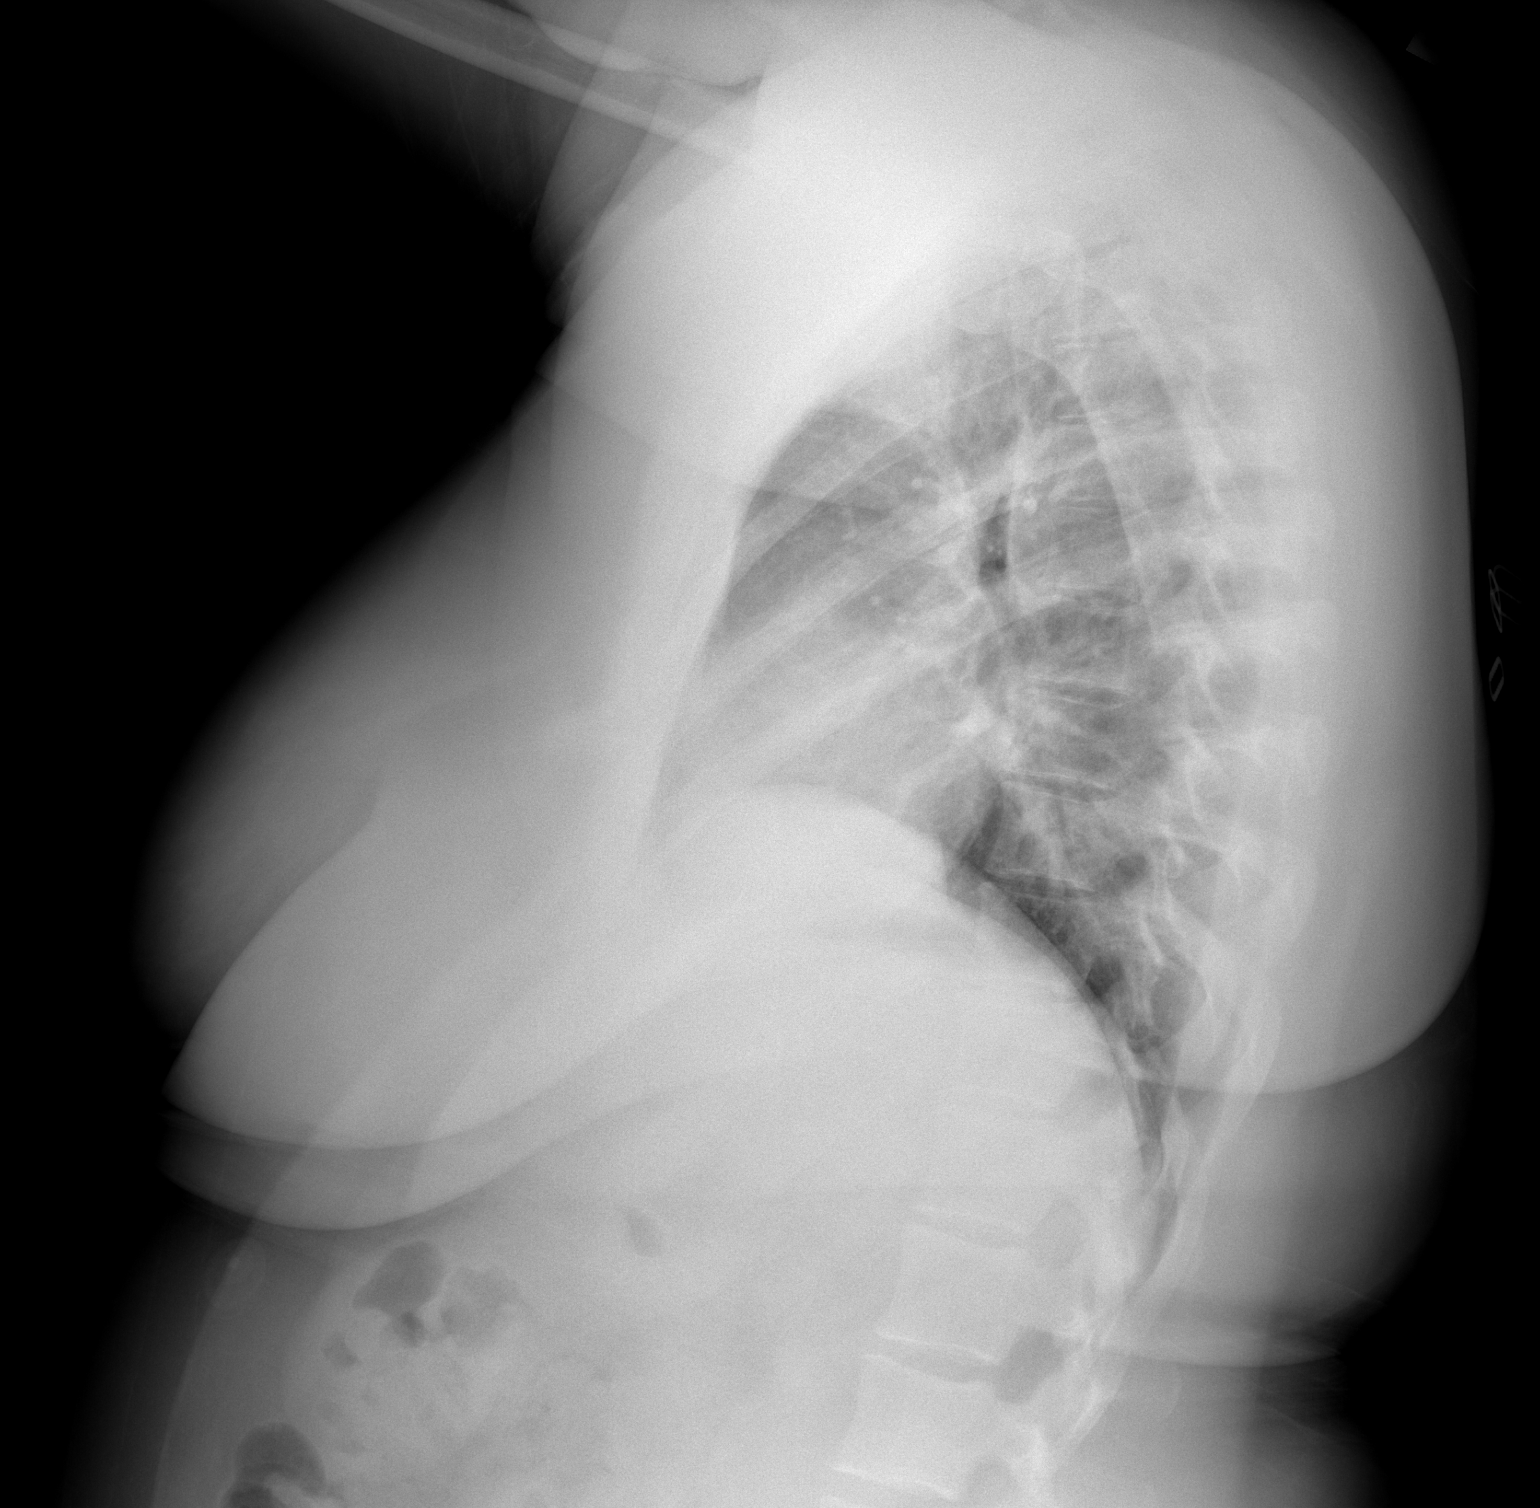

[2 of 2 positions shown; findings below may reference images not displayed]

FINDINGS: The heart size and mediastinal contours are within normal limits.

No focal consolidation. No pulmonary edema. No pleural effusion. No
pneumothorax.

No acute osseous abnormality.
IMPRESSION: No active cardiopulmonary disease.

## 2022-03-14 ENCOUNTER — Ambulatory Visit
Admission: RE | Admit: 2022-03-14 | Discharge: 2022-03-14 | Disposition: A | Discharge status: Home / Self Care | Payer: 59 | Source: Ambulatory Visit

## 2022-03-14 VITALS — BP 120/86 | HR 95 | Temp 98.4°F | Resp 16

## 2022-03-14 DIAGNOSIS — H66002 Acute suppurative otitis media without spontaneous rupture of ear drum, left ear: Secondary | ICD-10-CM | POA: Diagnosis not present

## 2022-03-14 DIAGNOSIS — J039 Acute tonsillitis, unspecified: Secondary | ICD-10-CM

## 2022-03-14 LAB — POCT RAPID STREP A (OFFICE): Rapid Strep A Screen: NEGATIVE

## 2022-03-14 LAB — POCT URINE PREGNANCY: Preg Test, Ur: POSITIVE — AB

## 2022-03-14 MED ORDER — AMOXICILLIN 875 MG PO TABS: 875.0000 mg | ORAL_TABLET | Freq: Two times a day (BID) | ORAL | 0 refills | Status: AC

## 2022-03-14 NOTE — Discharge Instructions (Addendum)
Please take the entire course of dye-free amoxicillin to treat the ear infection and possible strep throat.  Continue to push hydration with plenty of fluids.  Change her toothbrush after starting treatment.  You can use over-the-counter Cepacol lozenges without alcohol to help with the sore throat.  Please seek care if your symptoms persist or worsen despite treatment.

## 2022-03-14 NOTE — ED Triage Notes (Incomplete)
Pt reports sore throat, right ear pain x 2 days. Pain is worse at night. Tylenol gives relief.   Pt reports she is pregnant, she has an appointment on 03/20/22 for confirmation.

## 2022-03-14 NOTE — ED Provider Notes (Signed)
[facility]    CSN: [id number] Arrival date & time: 03/14/22  1042      History   Chief Complaint Chief Complaint  Patient presents with   Sore Throat    sharp pain in right ear - Entered by patient   Appointment   Otalgia         HPI Pamela Bell is a 31 y.o. female.   Patient presents with 2 days of left ear pain and sore throat that is worse with swallowing.  Pamela Bell denies fevers, bodyaches, chills, cough, congestion, sinus pressure, decreased hearing, and ear drainage.  Pamela Bell denies significant abdominal pain, nausea/vomiting, decreased appetite.  Pamela Bell has been more tired than normal, however is newly pregnant.  Pamela Bell denies known sick contacts, however reports her daughter's friend was recently over and was sick with an unknown illness.  Patient reports a history of seasonal allergies, is taking Claritin and Flonase without relief of symptoms.    Past Medical History:  Diagnosis Date   Anxiety    Bilateral ovarian cysts    Depression    takes zoloft   False labor after 37 weeks of gestation without delivery 03/04/2018   Irregular uterine contractions 03/04/2018   PONV (postoperative nausea and vomiting)     Patient Active Problem List   Diagnosis Date Noted   Labor and delivery indication for care or intervention 03/05/2018   False labor after 37 weeks of gestation without delivery 03/04/2018   Irregular uterine contractions 03/04/2018   Abnormal EKG 02/10/2018   ACUTE SEROUS OTITIS MEDIA 01/13/2009    Past Surgical History:  Procedure Laterality Date   ADENOIDECTOMY     ADENOIDECTOMY AND MYRINGOTOMY WITH TUBE PLACEMENT     EYE SURGERY      OB History     Gravida  2   Para  1   Term  1   Preterm      AB      Living  1      SAB      IAB      Ectopic      Multiple  0   Live Births  1            Home Medications    Prior to Admission medications   Medication Sig Start Date End Date Taking? Authorizing Provider   amoxicillin (AMOXIL) 875 MG tablet Take 1 tablet (875 mg total) by mouth 2 (two) times daily for 10 days. 03/14/22 03/24/22 Yes Valentino Nose, NP  loratadine (CLARITIN) 10 MG tablet Take 10 mg by mouth daily.   Yes [provider]  acetaminophen (TYLENOL) 500 MG tablet Take 1,000 mg by mouth every 6 (six) hours as needed for mild pain.    [provider]  Fenoprofen Calcium (NALFON) 200 MG CAPS capsule Take 3 tablets every 6 hours as needed for pain 03/08/18   Myna Hidalgo, DO  Prenatal Vit-Fe Fumarate-FA (PRENATAL MULTIVITAMIN) TABS tablet Take 1 tablet by mouth daily at 12 noon.    [provider]  sertraline (ZOLOFT) 50 MG tablet Take 50 mg by mouth daily.    [provider]    Family History Family History  Problem Relation Age of Onset   Asthma Father    Cancer Paternal Grandfather    ADD / ADHD Neg Hx    Alcohol abuse Neg Hx    Anxiety disorder Neg Hx    Arthritis Neg Hx    Birth defects Neg Hx  COPD Neg Hx    Depression Neg Hx    Diabetes Neg Hx    Early death Neg Hx    Drug abuse Neg Hx    Hearing loss Neg Hx    Heart disease Neg Hx    Hyperlipidemia Neg Hx    Intellectual disability Neg Hx    Hypertension Neg Hx    Kidney disease Neg Hx    Learning disabilities Neg Hx    Miscarriages / Stillbirths Neg Hx    Obesity Neg Hx    Stroke Neg Hx    Vision loss Neg Hx    Varicose Veins Neg Hx     Social History Social History   Tobacco Use   Smoking status: Never   Smokeless tobacco: Never  Substance Use Topics   Alcohol use: Not Currently   Drug use: Never     Allergies   Bee venom, Cranberry, Amoxicillin-pot clavulanate, Latex, and Red dye   Review of Systems Review of Systems Per HPI  Physical Exam Triage Vital Signs ED Triage Vitals  Enc Vitals Group     BP 03/14/22 1056 120/86     Pulse Rate 03/14/22 1056 95     Resp 03/14/22 1056 16     Temp 03/14/22 1056 98.4 F (36.9 C)     Temp Source 03/14/22  1056 Oral     SpO2 03/14/22 1056 96 %     Weight --      Height --      Head Circumference --      Peak Flow --      Pain Score 03/14/22 1053 5     Pain Loc --      Pain Edu? --      Excl. in GC? --    No data found.  Updated Vital Signs BP 120/86 (BP Location: Right Arm)   Pulse 95   Temp 98.4 F (36.9 C) (Oral)   Resp 16   LMP 03/18/2021   SpO2 96%   Visual Acuity Right Eye Distance:   Left Eye Distance:   Bilateral Distance:    Right Eye Near:   Left Eye Near:    Bilateral Near:     Physical Exam Vitals and nursing note reviewed.  Constitutional:      General: Pamela Bell is not in acute distress.    Appearance: Pamela Bell is well-developed. Pamela Bell is not toxic-appearing.  HENT:     Head: Normocephalic and atraumatic.     Right Ear: Ear canal normal. Tenderness present. No drainage or swelling. No middle ear effusion. Tympanic membrane is erythematous.     Left Ear: Tympanic membrane and ear canal normal. No drainage, swelling or tenderness.  No middle ear effusion. Tympanic membrane is not erythematous.     Nose: No congestion or rhinorrhea.     Mouth/Throat:     Mouth: Mucous membranes are moist.     Pharynx: Oropharynx is clear. Uvula midline. Posterior oropharyngeal erythema present. No oropharyngeal exudate or uvula swelling.     Tonsils: No tonsillar exudate. 2+ on the right. 2+ on the left.  Eyes:     Extraocular Movements:     Right eye: Normal extraocular motion.     Left eye: Normal extraocular motion.  Cardiovascular:     Rate and Rhythm: Normal rate and regular rhythm.  Pulmonary:     Effort: Pulmonary effort is normal. No respiratory distress.     Breath sounds: Normal breath sounds. No wheezing, rhonchi or rales.  Musculoskeletal:     Cervical back: Normal range of motion and neck supple.  Lymphadenopathy:     Cervical: Cervical adenopathy present.  Skin:    General: Skin is warm and dry.     Capillary Refill: Capillary refill takes less than 2 seconds.      Coloration: Skin is not pale.     Findings: No erythema or rash.  Neurological:     Mental Status: Pamela Bell is alert and oriented to person, place, and time.  Psychiatric:        Behavior: Behavior is cooperative.      UC Treatments / Results  Labs (all labs ordered are listed, but only abnormal results are displayed) Labs Reviewed  POCT URINE PREGNANCY - Abnormal; Notable for the following components:      Result Value   Preg Test, Ur Positive (*)    All other components within normal limits  POCT RAPID STREP A (OFFICE)    EKG   Radiology No results found.  Procedures Procedures (including critical care time)  Medications Ordered in UC Medications - No data to display  Initial Impression / Assessment and Plan / UC Course  I have reviewed the triage vital signs and the nursing notes.  Pertinent labs & imaging results that were available during my care of the patient were reviewed by me and considered in my medical decision making (see chart for details).    Patient is a very pleasant, well-appearing 31 year old female presenting for ear pain and sore throat.  Vital signs are stable and Pamela Bell is well-appearing.  Rapid strep throat test today is negative.  Given exam, lymphadenopathy, will cover for strep throat and also treat ear infection with amoxicillin 875 mg twice daily for 10 days.  Encouraged changing toothbrush after starting treatment.  It appears Pamela Bell is allergic to red dye and patient confirms this-I called pharmacy and asked them to dispense her the dye free amoxicillin.  I also made a note on the prescription.  Encouraged the patient to seek care if symptoms persist or worsen despite treatment. Final Clinical Impressions(s) / UC Diagnoses   Final diagnoses:  Non-recurrent acute suppurative otitis media of left ear without spontaneous rupture of tympanic membrane  Acute tonsillitis, unspecified etiology     Discharge Instructions      Please take the entire  course of dye-free amoxicillin to treat the ear infection and possible strep throat.  Continue to push hydration with plenty of fluids.  Change her toothbrush after starting treatment.  You can use over-the-counter Cepacol lozenges without alcohol to help with the sore throat.  Please seek care if your symptoms persist or worsen despite treatment.    ED Prescriptions     Medication Sig Dispense Auth. Provider   amoxicillin (AMOXIL) 875 MG tablet Take 1 tablet (875 mg total) by mouth 2 (two) times daily for 10 days. 20 tablet Valentino Nose, NP      PDMP not reviewed this encounter.   Valentino Nose, NP 03/14/22 [phone]

## 2022-03-14 NOTE — ED Triage Notes (Signed)
Pt reports sore throat, right ear pain x 2 days. Pain is worse at night. Tylenol gives relief.   Pt reports she is pregnant, she has an appointment on 03/20/22 for confirmation.  

## 2022-03-21 DIAGNOSIS — F909 Attention-deficit hyperactivity disorder, unspecified type: Secondary | ICD-10-CM | POA: Insufficient documentation

## 2022-03-21 DIAGNOSIS — F419 Anxiety disorder, unspecified: Secondary | ICD-10-CM | POA: Insufficient documentation

## 2022-03-21 DIAGNOSIS — E611 Iron deficiency: Secondary | ICD-10-CM | POA: Insufficient documentation

## 2022-04-20 LAB — OB RESULTS CONSOLE RUBELLA ANTIBODY, IGM: Rubella: IMMUNE

## 2022-04-20 LAB — OB RESULTS CONSOLE HIV ANTIBODY (ROUTINE TESTING): HIV: NONREACTIVE

## 2022-04-20 LAB — OB RESULTS CONSOLE GC/CHLAMYDIA
Chlamydia: NEGATIVE
Neisseria Gonorrhea: NEGATIVE

## 2022-04-20 LAB — OB RESULTS CONSOLE RPR: RPR: NONREACTIVE

## 2022-04-20 LAB — HEPATITIS C ANTIBODY: HCV Ab: NEGATIVE

## 2022-04-20 LAB — OB RESULTS CONSOLE HEPATITIS B SURFACE ANTIGEN: Hepatitis B Surface Ag: NEGATIVE

## 2022-07-26 DIAGNOSIS — B372 Candidiasis of skin and nail: Secondary | ICD-10-CM | POA: Diagnosis not present

## 2022-08-22 ENCOUNTER — Ambulatory Visit
Admission: EM | Admit: 2022-08-22 | Discharge: 2022-08-22 | Disposition: A | Discharge status: Home / Self Care | Payer: 59

## 2022-08-22 DIAGNOSIS — J069 Acute upper respiratory infection, unspecified: Secondary | ICD-10-CM | POA: Diagnosis not present

## 2022-08-22 MED ORDER — ALBUTEROL SULFATE HFA 108 (90 BASE) MCG/ACT IN AERS: 1.0000 | INHALATION_SPRAY | Freq: Four times a day (QID) | RESPIRATORY_TRACT | 0 refills | Status: DC | PRN

## 2022-08-22 NOTE — ED Provider Notes (Signed)
[facility]    CSN: [id number] Arrival date & time: 08/22/22  1045      History   Chief Complaint Chief Complaint  Patient presents with   Cough    HPI Pamela Bell is a 31 y.o. female.   Presenting today with 2-week history of cough.  Started like a cold but the cough has lingered.  Denies wheezing, fever, chills, body aches, sinus pressure, shortness of breath.  Taking her allergy medication, Delsym here and there with minimal relief.  No history of chronic pulmonary disease.  Of note, is [redacted] weeks pregnant.    Past Medical History:  Diagnosis Date   Anxiety    Bilateral ovarian cysts    Depression    takes zoloft   False labor after 37 weeks of gestation without delivery 03/04/2018   Irregular uterine contractions 03/04/2018   PONV (postoperative nausea and vomiting)     Patient Active Problem List   Diagnosis Date Noted   Labor and delivery indication for care or intervention 03/05/2018   False labor after 37 weeks of gestation without delivery 03/04/2018   Irregular uterine contractions 03/04/2018   Abnormal EKG 02/10/2018   ACUTE SEROUS OTITIS MEDIA 01/13/2009    Past Surgical History:  Procedure Laterality Date   ADENOIDECTOMY     ADENOIDECTOMY AND MYRINGOTOMY WITH TUBE PLACEMENT     EYE SURGERY      OB History     Gravida  2   Para  1   Term  1   Preterm      AB      Living  1      SAB      IAB      Ectopic      Multiple  0   Live Births  1            Home Medications    Prior to Admission medications   Medication Sig Start Date End Date Taking? Authorizing Provider  albuterol (VENTOLIN HFA) 108 (90 Base) MCG/ACT inhaler Inhale 1-2 puffs into the lungs every 6 (six) hours as needed for wheezing or shortness of breath. 08/22/22  Yes Particia Nearing, PA-C  Ferrous Sulfate (IRON PO) Take by mouth.   Yes [provider]  metoCLOPramide (REGLAN) 10 MG tablet Take 10 mg by mouth 4 (four) times daily.    Yes [provider]  Prenatal Vit-Fe Fumarate-FA (PRENATAL MULTIVITAMIN) TABS tablet Take 1 tablet by mouth daily at 12 noon.   Yes [provider]  sertraline (ZOLOFT) 50 MG tablet Take 75 mg by mouth daily.   Yes [provider]  acetaminophen (TYLENOL) 500 MG tablet Take 1,000 mg by mouth every 6 (six) hours as needed for mild pain.    [provider]  Fenoprofen Calcium (NALFON) 200 MG CAPS capsule Take 3 tablets every 6 hours as needed for pain 03/08/18   Myna Hidalgo, DO  loratadine (CLARITIN) 10 MG tablet Take 10 mg by mouth daily.    [provider]    Family History Family History  Problem Relation Age of Onset   Asthma Father    Cancer Paternal Grandfather    ADD / ADHD Neg Hx    Alcohol abuse Neg Hx    Anxiety disorder Neg Hx    Arthritis Neg Hx    Birth defects Neg Hx    COPD Neg Hx    Depression Neg Hx    Diabetes Neg Hx  Early death Neg Hx    Drug abuse Neg Hx    Hearing loss Neg Hx    Heart disease Neg Hx    Hyperlipidemia Neg Hx    Intellectual disability Neg Hx    Hypertension Neg Hx    Kidney disease Neg Hx    Learning disabilities Neg Hx    Miscarriages / Stillbirths Neg Hx    Obesity Neg Hx    Stroke Neg Hx    Vision loss Neg Hx    Varicose Veins Neg Hx     Social History Social History   Tobacco Use   Smoking status: Never   Smokeless tobacco: Never  Vaping Use   Vaping Use: Never used  Substance Use Topics   Alcohol use: Not Currently   Drug use: Never     Allergies   Bee venom, Cranberry, Amoxicillin-pot clavulanate, Augmentin [amoxicillin-pot clavulanate], Latex, and Red dye   Review of Systems Review of Systems Per HPI  Physical Exam Triage Vital Signs ED Triage Vitals  Enc Vitals Group     BP 08/22/22 1355 121/79     Pulse Rate 08/22/22 1355 (!) 102     Resp 08/22/22 1355 18     Temp 08/22/22 1355 98.3 F (36.8 C)     Temp Source 08/22/22 1355 Oral     SpO2 08/22/22 1355 97  %     Weight --      Height --      Head Circumference --      Peak Flow --      Pain Score 08/22/22 1350 0     Pain Loc --      Pain Edu? --      Excl. in GC? --    No data found.  Updated Vital Signs BP 121/79 (BP Location: Right Arm)   Pulse (!) 102   Temp 98.3 F (36.8 C) (Oral)   Resp 18   SpO2 97%   Visual Acuity Right Eye Distance:   Left Eye Distance:   Bilateral Distance:    Right Eye Near:   Left Eye Near:    Bilateral Near:     Physical Exam Vitals and nursing note reviewed.  Constitutional:      Appearance: Normal appearance.  HENT:     Head: Atraumatic.     Right Ear: Tympanic membrane and external ear normal.     Left Ear: Tympanic membrane and external ear normal.     Nose: Nose normal.     Mouth/Throat:     Mouth: Mucous membranes are moist.     Pharynx: No oropharyngeal exudate or posterior oropharyngeal erythema.  Eyes:     Extraocular Movements: Extraocular movements intact.     Conjunctiva/sclera: Conjunctivae normal.  Cardiovascular:     Rate and Rhythm: Normal rate and regular rhythm.     Heart sounds: Normal heart sounds.  Pulmonary:     Effort: Pulmonary effort is normal.     Breath sounds: Normal breath sounds. No wheezing or rales.  Musculoskeletal:        General: Normal range of motion.     Cervical back: Normal range of motion and neck supple.  Skin:    General: Skin is warm and dry.  Neurological:     Mental Status: She is alert and oriented to person, place, and time.  Psychiatric:        Mood and Affect: Mood normal.        Thought Content: Thought content  normal.      UC Treatments / Results  Labs (all labs ordered are listed, but only abnormal results are displayed) Labs Reviewed - No data to display  EKG   Radiology No results found.  Procedures Procedures (including critical care time)  Medications Ordered in UC Medications - No data to display  Initial Impression / Assessment and Plan / UC Course  I  have reviewed the triage vital signs and the nursing notes.  Pertinent labs & imaging results that were available during my care of the patient were reviewed by me and considered in my medical decision making (see chart for details).     Only tachycardic in triage, likely more secondary to pregnancy than any illness.  Otherwise vital signs benign and reassuring, exam very reassuring and she is in no acute distress.  Treat with albuterol, discussed safe over-the-counter remedies additionally.  Follow-up if not resolving.  Final Clinical Impressions(s) / UC Diagnoses   Final diagnoses:  Viral URI with cough     Discharge Instructions      You may take Flonase, antihistamines, Sudafed, Delsym and the albuterol inhaler that I have sent.  Your oxygen looks good today and your lungs are clear on exam.  If your symptoms continue to worsen follow-up for a recheck.    ED Prescriptions     Medication Sig Dispense Auth. Provider   albuterol (VENTOLIN HFA) 108 (90 Base) MCG/ACT inhaler Inhale 1-2 puffs into the lungs every 6 (six) hours as needed for wheezing or shortness of breath. 18 g Particia Nearing, [place]      PDMP not reviewed this encounter.   Particia Nearing, [place] 08/22/22 [phone]

## 2022-08-22 NOTE — ED Triage Notes (Signed)
Pt presents with cough x 2+ weeks that started after stopping allergy medicine..  Denies having fever, body aches, sinus pressure, etc.  Just cough.

## 2022-08-22 NOTE — Discharge Instructions (Signed)
You may take Flonase, antihistamines, Sudafed, Delsym and the albuterol inhaler that I have sent.  Your oxygen looks good today and your lungs are clear on exam.  If your symptoms continue to worsen follow-up for a recheck.

## 2022-08-26 ENCOUNTER — Encounter (HOSPITAL_COMMUNITY): Payer: Self-pay | Admitting: Obstetrics and Gynecology

## 2022-08-26 ENCOUNTER — Other Ambulatory Visit: Payer: Self-pay

## 2022-08-26 ENCOUNTER — Inpatient Hospital Stay (HOSPITAL_COMMUNITY)
Admission: AD | Admit: 2022-08-26 | Discharge: 2022-08-27 | Disposition: A | Discharge status: Home / Self Care | Payer: 59 | Attending: Obstetrics and Gynecology | Admitting: Obstetrics and Gynecology

## 2022-08-26 ENCOUNTER — Inpatient Hospital Stay (HOSPITAL_COMMUNITY): Payer: 59

## 2022-08-26 DIAGNOSIS — J40 Bronchitis, not specified as acute or chronic: Secondary | ICD-10-CM | POA: Diagnosis not present

## 2022-08-26 DIAGNOSIS — Z79899 Other long term (current) drug therapy: Secondary | ICD-10-CM | POA: Insufficient documentation

## 2022-08-26 DIAGNOSIS — O99513 Diseases of the respiratory system complicating pregnancy, third trimester: Secondary | ICD-10-CM | POA: Diagnosis not present

## 2022-08-26 DIAGNOSIS — Z3689 Encounter for other specified antenatal screening: Secondary | ICD-10-CM

## 2022-08-26 DIAGNOSIS — Z3A29 29 weeks gestation of pregnancy: Secondary | ICD-10-CM | POA: Diagnosis not present

## 2022-08-26 DIAGNOSIS — Z1152 Encounter for screening for COVID-19: Secondary | ICD-10-CM | POA: Diagnosis not present

## 2022-08-26 DIAGNOSIS — R0602 Shortness of breath: Secondary | ICD-10-CM | POA: Diagnosis present

## 2022-08-26 LAB — URINALYSIS, ROUTINE W REFLEX MICROSCOPIC
Bilirubin Urine: NEGATIVE
Glucose, UA: NEGATIVE mg/dL
Hgb urine dipstick: NEGATIVE
Ketones, ur: NEGATIVE mg/dL
Leukocytes,Ua: NEGATIVE
Nitrite: NEGATIVE
Protein, ur: NEGATIVE mg/dL
Specific Gravity, Urine: 1.006 (ref 1.005–1.030)
pH: 7 (ref 5.0–8.0)

## 2022-08-26 NOTE — MAU Provider Note (Incomplete)
History     CSN: [id number]  Arrival date and time: 08/26/22 2218   Event Date/Time   First Provider Initiated Contact with Patient 08/26/22 2314      Chief Complaint  Patient presents with  . Cough  . Shortness of Breath  . Decreased Fetal Movement   31 y.o. G2P1001 @29 .1 wks presenting with persistent cough. Report onset almost 3 weeks ago. Cough is non-productive. Reports SOB at times when she is coughing a lot. Reports urinary incontinence with coughing. Denies other resp sx. Denies fever or chills. No sick contacts. She has been using an albuterol inhaler but it's not helping much. No pregnancy concerns. Reports good FM.     OB History     Gravida  2   Para  1   Term  1   Preterm      AB      Living  1      SAB      IAB      Ectopic      Multiple  0   Live Births  1           Past Medical History:  Diagnosis Date  . Anxiety   . Bilateral ovarian cysts   . Depression    takes zoloft  . False labor after 37 weeks of gestation without delivery 03/04/2018  . Irregular uterine contractions 03/04/2018  . PONV (postoperative nausea and vomiting)     Past Surgical History:  Procedure Laterality Date  . ADENOIDECTOMY    . ADENOIDECTOMY AND MYRINGOTOMY WITH TUBE PLACEMENT    . EYE SURGERY      Family History  Problem Relation Age of Onset  . Asthma Father   . Cancer Paternal Grandfather   . ADD / ADHD Neg Hx   . Alcohol abuse Neg Hx   . Anxiety disorder Neg Hx   . Arthritis Neg Hx   . Birth defects Neg Hx   . COPD Neg Hx   . Depression Neg Hx   . Diabetes Neg Hx   . Early death Neg Hx   . Drug abuse Neg Hx   . Hearing loss Neg Hx   . Heart disease Neg Hx   . Hyperlipidemia Neg Hx   . Intellectual disability Neg Hx   . Hypertension Neg Hx   . Kidney disease Neg Hx   . Learning disabilities Neg Hx   . Miscarriages / Stillbirths Neg Hx   . Obesity Neg Hx   . Stroke Neg Hx   . Vision loss Neg Hx   . Varicose Veins Neg Hx     Social  History   Tobacco Use  . Smoking status: Never  . Smokeless tobacco: Never  Vaping Use  . Vaping Use: Never used  Substance Use Topics  . Alcohol use: Not Currently  . Drug use: Never    Allergies:  Allergies  Allergen Reactions  . Bee Venom Anaphylaxis  . Cranberry Anaphylaxis  . Amoxicillin-Pot Clavulanate     REACTION: Hives Has patient had a PCN reaction causing immediate rash, facial/tongue/throat swelling, SOB or lightheadedness with hypotension: No Has patient had a PCN reaction causing severe rash involving mucus membranes or skin necrosis: No Has patient had a PCN reaction that required hospitalization: No Has patient had a PCN reaction occurring within the last 10 years: No If all of the above answers are "NO", then may proceed with Cephalosporin use.   . Augmentin [Amoxicillin-Pot Clavulanate]  As a child   . Latex Rash  . Red Dye Hives    Medications Prior to Admission  Medication Sig Dispense Refill Last Dose  . albuterol (VENTOLIN HFA) 108 (90 Base) MCG/ACT inhaler Inhale 1-2 puffs into the lungs every 6 (six) hours as needed for wheezing or shortness of breath. 18 g 0 08/26/2022  . Ferrous Sulfate (IRON PO) Take by mouth.   08/26/2022  . loratadine (CLARITIN) 10 MG tablet Take 10 mg by mouth daily.   08/26/2022  . metoCLOPramide (REGLAN) 10 MG tablet Take 10 mg by mouth 4 (four) times daily.   08/26/2022  . Prenatal Vit-Fe Fumarate-FA (PRENATAL MULTIVITAMIN) TABS tablet Take 1 tablet by mouth daily at 12 noon.   08/26/2022  . sertraline (ZOLOFT) 50 MG tablet Take 75 mg by mouth daily.   08/26/2022  . acetaminophen (TYLENOL) 500 MG tablet Take 1,000 mg by mouth every 6 (six) hours as needed for mild pain.     . Fenoprofen Calcium (NALFON) 200 MG CAPS capsule Take 3 tablets every 6 hours as needed for pain 120 each 0     Review of Systems  Constitutional:  Negative for chills and fever.  HENT:  Negative for congestion.   Respiratory:  Positive for cough  and shortness of breath.    Physical Exam   Blood pressure 129/76, pulse 99, temperature 98 F (36.7 C), temperature source Oral, resp. rate 19, height 5\' 2"  (1.575 m), weight 92.7 kg, SpO2 96 %.  Physical Exam Vitals and nursing note reviewed.  Constitutional:      General: She is not in acute distress.    Appearance: Normal appearance.  HENT:     Head: Normocephalic and atraumatic.  Cardiovascular:     Rate and Rhythm: Normal rate and regular rhythm.     Heart sounds: Normal heart sounds.  Pulmonary:     Effort: Pulmonary effort is normal. No respiratory distress.     Breath sounds: Normal breath sounds. No stridor. No wheezing, rhonchi or rales.  Musculoskeletal:        General: Normal range of motion.     Cervical back: Normal range of motion.  Skin:    General: Skin is warm and dry.  Neurological:     General: No focal deficit present.     Mental Status: She is alert and oriented to person, place, and time.  Psychiatric:        Mood and Affect: Mood normal.        Behavior: Behavior normal.    EFM: 155 bpm, mod variability, + accels, no decels Toco: none  Results for orders placed or performed during the hospital encounter of 08/26/22 (from the past 24 hour(s))  Urinalysis, Routine w reflex microscopic Urine, Clean Catch     Status: Abnormal   Collection Time: 08/26/22 10:47 PM  Result Value Ref Range   Color, Urine STRAW (A) YELLOW   APPearance CLEAR CLEAR   Specific Gravity, Urine 1.006 1.005 - 1.030   pH 7.0 5.0 - 8.0   Glucose, UA NEGATIVE NEGATIVE mg/dL   Hgb urine dipstick NEGATIVE NEGATIVE   Bilirubin Urine NEGATIVE NEGATIVE   Ketones, ur NEGATIVE NEGATIVE mg/dL   Protein, ur NEGATIVE NEGATIVE mg/dL   Nitrite NEGATIVE NEGATIVE   Leukocytes,Ua NEGATIVE NEGATIVE   MAU Course  Procedures  MDM ***  Assessment and Plan  ***  08/28/22 08/26/2022, 11:31 PM

## 2022-08-26 NOTE — MAU Note (Signed)
.  Pamela Bell is a 31 y.o. at [redacted]w[redacted]d here in MAU reporting: sent by OB - persistent cough since 12/10 went to urgent care was given an inhaler but has not helped. Started having wheezing got worse tonight and increase in SOB over the past 2 days. Denies congestion, fever, body aches, or stuffy nose. Has not been tested for any respiratory illnesses. States her OB wanted her to come here tonight for chest x-ray. Denies VB, LOF, or contractions. States baby hasn't moved as much the past couple of days - last movement was at 2030. Reports recent diagnoses of GDM - diet controlled.   Pain score: 0 Vitals:   08/26/22 2237  BP: 136/78  Pulse: (!) 103  Resp: 19  Temp: 98 F (36.7 C)  SpO2: 97%     FHT:150 Lab orders placed from triage:  UA

## 2022-08-26 NOTE — MAU Provider Note (Signed)
History     CSN: [id number]  Arrival date and time: 08/26/22 2218   Event Date/Time   First Provider Initiated Contact with Patient 08/26/22 2314      Chief Complaint  Patient presents with   Cough   Shortness of Breath   Decreased Fetal Movement   31 y.o. G2P1001 @29 .1 wks presenting with persistent cough. Report onset almost 3 weeks ago. Cough is non-productive. Reports SOB at times when she is coughing a lot. Reports urinary incontinence with coughing. Denies other resp sx. Denies fever or chills. No sick contacts. She has been using an albuterol inhaler but it's not helping much. No pregnancy concerns. Reports good FM.     OB History     Gravida  2   Para  1   Term  1   Preterm      AB      Living  1      SAB      IAB      Ectopic      Multiple  0   Live Births  1           Past Medical History:  Diagnosis Date   Anxiety    Bilateral ovarian cysts    Depression    takes zoloft   False labor after 37 weeks of gestation without delivery 03/04/2018   Irregular uterine contractions 03/04/2018   PONV (postoperative nausea and vomiting)     Past Surgical History:  Procedure Laterality Date   ADENOIDECTOMY     ADENOIDECTOMY AND MYRINGOTOMY WITH TUBE PLACEMENT     EYE SURGERY      Family History  Problem Relation Age of Onset   Asthma Father    Cancer Paternal Grandfather    ADD / ADHD Neg Hx    Alcohol abuse Neg Hx    Anxiety disorder Neg Hx    Arthritis Neg Hx    Birth defects Neg Hx    COPD Neg Hx    Depression Neg Hx    Diabetes Neg Hx    Early death Neg Hx    Drug abuse Neg Hx    Hearing loss Neg Hx    Heart disease Neg Hx    Hyperlipidemia Neg Hx    Intellectual disability Neg Hx    Hypertension Neg Hx    Kidney disease Neg Hx    Learning disabilities Neg Hx    Miscarriages / Stillbirths Neg Hx    Obesity Neg Hx    Stroke Neg Hx    Vision loss Neg Hx    Varicose Veins Neg Hx     Social History   Tobacco Use   Smoking  status: Never   Smokeless tobacco: Never  Vaping Use   Vaping Use: Never used  Substance Use Topics   Alcohol use: Not Currently   Drug use: Never    Allergies:  Allergies  Allergen Reactions   Bee Venom Anaphylaxis   Cranberry Anaphylaxis   Amoxicillin-Pot Clavulanate     REACTION: Hives Has patient had a PCN reaction causing immediate rash, facial/tongue/throat swelling, SOB or lightheadedness with hypotension: No Has patient had a PCN reaction causing severe rash involving mucus membranes or skin necrosis: No Has patient had a PCN reaction that required hospitalization: No Has patient had a PCN reaction occurring within the last 10 years: No If all of the above answers are "NO", then may proceed with Cephalosporin use.    Augmentin [Amoxicillin-Pot Clavulanate]  As a child    Latex Rash   Red Dye Hives    No medications prior to admission.    Review of Systems  Constitutional:  Negative for chills and fever.  HENT:  Negative for congestion.   Respiratory:  Positive for cough and shortness of breath.    Physical Exam   Blood pressure 122/68, pulse (!) 101, temperature 98 F (36.7 C), temperature source Oral, resp. rate 19, height 5\' 2"  (1.575 m), weight 92.7 kg, SpO2 97 %.  Physical Exam Vitals and nursing note reviewed.  Constitutional:      General: She is not in acute distress.    Appearance: Normal appearance.  HENT:     Head: Normocephalic and atraumatic.  Cardiovascular:     Rate and Rhythm: Normal rate and regular rhythm.     Heart sounds: Normal heart sounds.  Pulmonary:     Effort: Pulmonary effort is normal. No accessory muscle usage or respiratory distress.     Breath sounds: Normal breath sounds. No stridor. No wheezing, rhonchi or rales.  Musculoskeletal:        General: Normal range of motion.     Cervical back: Normal range of motion.  Skin:    General: Skin is warm and dry.  Neurological:     General: No focal deficit present.      Mental Status: She is alert and oriented to person, place, and time.  Psychiatric:        Mood and Affect: Mood normal.        Behavior: Behavior normal.    EFM: 155 bpm, mod variability, + accels, no decels Toco: none  Results for orders placed or performed during the hospital encounter of 08/26/22 (from the past 24 hour(s))  Urinalysis, Routine w reflex microscopic Urine, Clean Catch     Status: Abnormal   Collection Time: 08/26/22 10:47 PM  Result Value Ref Range   Color, Urine STRAW (A) YELLOW   APPearance CLEAR CLEAR   Specific Gravity, Urine 1.006 1.005 - 1.030   pH 7.0 5.0 - 8.0   Glucose, UA NEGATIVE NEGATIVE mg/dL   Hgb urine dipstick NEGATIVE NEGATIVE   Bilirubin Urine NEGATIVE NEGATIVE   Ketones, ur NEGATIVE NEGATIVE mg/dL   Protein, ur NEGATIVE NEGATIVE mg/dL   Nitrite NEGATIVE NEGATIVE   Leukocytes,Ua NEGATIVE NEGATIVE  Resp panel by RT-PCR (RSV, Flu A&B, Covid) Anterior Nasal Swab     Status: None   Collection Time: 08/26/22 10:58 PM   Specimen: Anterior Nasal Swab  Result Value Ref Range   SARS Coronavirus 2 by RT PCR NEGATIVE NEGATIVE   Influenza A by PCR NEGATIVE NEGATIVE   Influenza B by PCR NEGATIVE NEGATIVE   Resp Syncytial Virus by PCR NEGATIVE NEGATIVE   DG Chest 2 View  Result Date: 08/27/2022 CLINICAL DATA:  Shortness of breath and cough. EXAM: CHEST - 2 VIEW COMPARISON:  April 12, 2021 FINDINGS: The heart size and mediastinal contours are within normal limits. Low lung volumes are noted. Mild atelectasis is seen within the bilateral lung bases. Overlying breast attenuation artifact is also seen within this region. There is no evidence of a pleural effusion or pneumothorax. The visualized skeletal structures are unremarkable. IMPRESSION: Low lung volumes with mild bibasilar atelectasis. Electronically Signed   By: April 14, 2021 M.D.   On: 08/27/2022 00:07    MAU Course  Procedures  MDM Labs and imaging ordered and reviewed. No sign of viral  infection or pneumonia. No indication for abx or  steroids, consult with Dr. Berton Lan who agrees. Will manage sx but rec f/u with PCP if sx persist. Stable for discharge.  Assessment and Plan   1. [redacted] weeks gestation of pregnancy   2. NST (non-stress test) reactive   3. Bronchitis    Discharge home Follow up at [facility] as scheduled Rx Tessalon Return precautions  Allergies as of 08/27/2022       Reactions   Bee Venom Anaphylaxis   Cranberry Anaphylaxis   Amoxicillin-pot Clavulanate    REACTION: Hives Has patient had a PCN reaction causing immediate rash, facial/tongue/throat swelling, SOB or lightheadedness with hypotension: No Has patient had a PCN reaction causing severe rash involving mucus membranes or skin necrosis: No Has patient had a PCN reaction that required hospitalization: No Has patient had a PCN reaction occurring within the last 10 years: No If all of the above answers are "NO", then may proceed with Cephalosporin use.   Augmentin [amoxicillin-pot Clavulanate]    As a child    Latex Rash   Red Dye Hives        Medication List     TAKE these medications    acetaminophen 500 MG tablet Commonly known as: TYLENOL Take 1,000 mg by mouth every 6 (six) hours as needed for mild pain.   albuterol 108 (90 Base) MCG/ACT inhaler Commonly known as: VENTOLIN HFA Inhale 1-2 puffs into the lungs every 6 (six) hours as needed for wheezing or shortness of breath.   benzonatate 200 MG capsule Commonly known as: TESSALON Take 1 capsule (200 mg total) by mouth 3 (three) times daily as needed for cough.   Fenoprofen Calcium 200 MG Caps capsule Commonly known as: NALFON Take 3 tablets every 6 hours as needed for pain   IRON PO Take by mouth.   loratadine 10 MG tablet Commonly known as: CLARITIN Take 10 mg by mouth daily.   metoCLOPramide 10 MG tablet Commonly known as: REGLAN Take 10 mg by mouth 4 (four) times daily.   prenatal multivitamin Tabs  tablet Take 1 tablet by mouth daily at 12 noon.   sertraline 50 MG tablet Commonly known as: ZOLOFT Take 75 mg by mouth daily.        Donette Larry, CNM 08/27/2022, 1:23 AM

## 2022-08-27 DIAGNOSIS — Z3689 Encounter for other specified antenatal screening: Secondary | ICD-10-CM | POA: Diagnosis not present

## 2022-08-27 DIAGNOSIS — Z3A29 29 weeks gestation of pregnancy: Secondary | ICD-10-CM

## 2022-08-27 DIAGNOSIS — J40 Bronchitis, not specified as acute or chronic: Secondary | ICD-10-CM

## 2022-08-27 DIAGNOSIS — O99513 Diseases of the respiratory system complicating pregnancy, third trimester: Secondary | ICD-10-CM | POA: Diagnosis not present

## 2022-08-27 LAB — RESP PANEL BY RT-PCR (RSV, FLU A&B, COVID)  RVPGX2
Influenza A by PCR: NEGATIVE
Influenza B by PCR: NEGATIVE
Resp Syncytial Virus by PCR: NEGATIVE
SARS Coronavirus 2 by RT PCR: NEGATIVE

## 2022-08-27 MED ORDER — BENZONATATE 200 MG PO CAPS: 200.0000 mg | ORAL_CAPSULE | Freq: Three times a day (TID) | ORAL | 0 refills | Status: DC | PRN

## 2022-09-04 DIAGNOSIS — O24419 Gestational diabetes mellitus in pregnancy, unspecified control: Secondary | ICD-10-CM | POA: Insufficient documentation

## 2022-09-13 ENCOUNTER — Inpatient Hospital Stay (HOSPITAL_COMMUNITY): Payer: 59

## 2022-09-13 ENCOUNTER — Encounter (HOSPITAL_COMMUNITY): Payer: Self-pay

## 2022-09-13 ENCOUNTER — Inpatient Hospital Stay (HOSPITAL_COMMUNITY)
Admission: AD | Admit: 2022-09-13 | Discharge: 2022-09-13 | Disposition: A | Discharge status: Home / Self Care | Payer: 59 | Attending: Obstetrics and Gynecology | Admitting: Obstetrics and Gynecology

## 2022-09-13 DIAGNOSIS — R079 Chest pain, unspecified: Secondary | ICD-10-CM | POA: Insufficient documentation

## 2022-09-13 DIAGNOSIS — R071 Chest pain on breathing: Secondary | ICD-10-CM | POA: Insufficient documentation

## 2022-09-13 DIAGNOSIS — J9811 Atelectasis: Secondary | ICD-10-CM | POA: Diagnosis not present

## 2022-09-13 DIAGNOSIS — Z3A31 31 weeks gestation of pregnancy: Secondary | ICD-10-CM

## 2022-09-13 DIAGNOSIS — O99413 Diseases of the circulatory system complicating pregnancy, third trimester: Secondary | ICD-10-CM | POA: Insufficient documentation

## 2022-09-13 DIAGNOSIS — O26613 Liver and biliary tract disorders in pregnancy, third trimester: Secondary | ICD-10-CM | POA: Diagnosis not present

## 2022-09-13 DIAGNOSIS — O26893 Other specified pregnancy related conditions, third trimester: Secondary | ICD-10-CM | POA: Insufficient documentation

## 2022-09-13 LAB — COMPREHENSIVE METABOLIC PANEL
ALT: 32 U/L (ref 0–44)
AST: 24 U/L (ref 15–41)
Albumin: 2.6 g/dL — ABNORMAL LOW (ref 3.5–5.0)
Alkaline Phosphatase: 184 U/L — ABNORMAL HIGH (ref 38–126)
Anion gap: 11 (ref 5–15)
BUN: <5 mg/dL — ABNORMAL LOW (ref 6–20)
CO2: 19 mmol/L — ABNORMAL LOW (ref 22–32)
Calcium: 8.8 mg/dL — ABNORMAL LOW (ref 8.9–10.3)
Chloride: 102 mmol/L (ref 98–111)
Creatinine, Ser: 0.64 mg/dL (ref 0.44–1.00)
GFR, Estimated: >60 mL/min (ref >60)
Glucose, Bld: 86 mg/dL (ref 70–99)
Potassium: 3.7 mmol/L (ref 3.5–5.1)
Sodium: 132 mmol/L — ABNORMAL LOW (ref 135–145)
Total Bilirubin: 0.5 mg/dL (ref 0.3–1.2)
Total Protein: 6.6 g/dL (ref 6.5–8.1)

## 2022-09-13 LAB — CBC
HCT: 32.1 % — ABNORMAL LOW (ref 36.0–46.0)
Hemoglobin: 10.7 g/dL — ABNORMAL LOW (ref 12.0–15.0)
MCH: 28.8 pg (ref 26.0–34.0)
MCHC: 33.3 g/dL (ref 30.0–36.0)
MCV: 86.3 fL (ref 80.0–100.0)
Platelets: 268 10*3/uL (ref 150–400)
RBC: 3.72 MIL/uL — ABNORMAL LOW (ref 3.87–5.11)
RDW: 15.3 % (ref 11.5–15.5)
WBC: 10.7 10*3/uL — ABNORMAL HIGH (ref 4.0–10.5)
nRBC: 0 % (ref 0.0–0.2)

## 2022-09-13 LAB — URINALYSIS, ROUTINE W REFLEX MICROSCOPIC
Bilirubin Urine: NEGATIVE
Glucose, UA: NEGATIVE mg/dL
Hgb urine dipstick: NEGATIVE
Ketones, ur: 5 mg/dL — AB
Leukocytes,Ua: NEGATIVE
Nitrite: NEGATIVE
Protein, ur: 30 mg/dL — AB
Specific Gravity, Urine: 1.017 (ref 1.005–1.030)
pH: 6 (ref 5.0–8.0)

## 2022-09-13 LAB — TROPONIN I (HIGH SENSITIVITY): Troponin I (High Sensitivity): 3 ng/L (ref <18)

## 2022-09-13 MED ORDER — ACETAMINOPHEN 500 MG PO TABS
1000.0000 mg | ORAL_TABLET | Freq: Once | ORAL | Status: AC
  Administered 2022-09-13: 1000 mg via ORAL
  Filled 2022-09-13: qty 2

## 2022-09-13 MED ORDER — CYCLOBENZAPRINE HCL 10 MG PO TABS: 10.0000 mg | ORAL_TABLET | Freq: Three times a day (TID) | ORAL | 0 refills | Status: DC | PRN

## 2022-09-13 MED ORDER — CYCLOBENZAPRINE HCL 5 MG PO TABS
10.0000 mg | ORAL_TABLET | Freq: Once | ORAL | Status: AC
  Administered 2022-09-13: 10 mg via ORAL
  Filled 2022-09-13: qty 2

## 2022-09-13 MED ORDER — IOHEXOL 350 MG/ML SOLN
75.0000 mL | Freq: Once | INTRAVENOUS | Status: AC | PRN
  Administered 2022-09-13: 75 mL via INTRAVENOUS

## 2022-09-13 NOTE — MAU Provider Note (Signed)
History     [id number]  Arrival date and time: 09/13/22 1508    Chief Complaint  Patient presents with   Left Rib Pain     HPI Pamela Bell is a 32 y.o. at [redacted]w[redacted]d who presents for left rib pain. Symptoms started a few weeks ago but worsened on Sunday. Initially pain occurred with coughing or sneezing. Since Sunday, pain has been constant but worsens with coughing. Has had cough/resp illness since mid December. Symptoms worsened New Years Eve. Continues to have cough that occasionally is productive of tan mucous. Pain is in her left rib & radiates to her left breast. Also reports some increased shortness of breath.  Denies fever, n/v, or OB complaints. Reports good fetal movement.  Has been treating symptoms with robitussin & tylenol.  Denies history of VTE, no recent travel, & is a non smoker.   OB History     Gravida  2   Para  1   Term  1   Preterm      AB      Living  1      SAB      IAB      Ectopic      Multiple  0   Live Births  1           Past Medical History:  Diagnosis Date   Anxiety    Bilateral ovarian cysts    Depression    takes zoloft   PONV (postoperative nausea and vomiting)     Past Surgical History:  Procedure Laterality Date   ADENOIDECTOMY AND MYRINGOTOMY WITH TUBE PLACEMENT     EYE SURGERY      Family History  Problem Relation Age of Onset   Asthma Father    Cancer Paternal Grandfather    ADD / ADHD Neg Hx    Alcohol abuse Neg Hx    Anxiety disorder Neg Hx    Arthritis Neg Hx    Birth defects Neg Hx    COPD Neg Hx    Depression Neg Hx    Diabetes Neg Hx    Early death Neg Hx    Drug abuse Neg Hx    Hearing loss Neg Hx    Heart disease Neg Hx    Hyperlipidemia Neg Hx    Intellectual disability Neg Hx    Hypertension Neg Hx    Kidney disease Neg Hx    Learning disabilities Neg Hx    Miscarriages / Stillbirths Neg Hx    Obesity Neg Hx    Stroke Neg Hx    Vision loss Neg Hx    Varicose Veins Neg Hx      Allergies  Allergen Reactions   Bee Venom Anaphylaxis   Cranberry Anaphylaxis   Augmentin [Amoxicillin-Pot Clavulanate]     As a child    Latex Rash   Red Dye Hives    No current facility-administered medications on file prior to encounter.   Current Outpatient Medications on File Prior to Encounter  Medication Sig Dispense Refill   ACCU-CHEK GUIDE test strip 4 (four) times daily.     Accu-Chek Softclix Lancets lancets SMARTSIG:2 Topical Twice Daily     Blood Glucose Monitoring Suppl (ACCU-CHEK GUIDE ME) w/Device KIT 4 (four) times daily.     Ferrous Sulfate (IRON PO) Take by mouth.     loratadine (CLARITIN) 10 MG tablet Take 10 mg by mouth daily.     metoCLOPramide (REGLAN) 10 MG tablet Take 10 mg  by mouth 4 (four) times daily.     Prenatal Vit-Fe Fumarate-FA (PRENATAL MULTIVITAMIN) TABS tablet Take 1 tablet by mouth daily at 12 noon.     sertraline (ZOLOFT) 50 MG tablet Take 75 mg by mouth daily.     acetaminophen (TYLENOL) 500 MG tablet Take 1,000 mg by mouth every 6 (six) hours as needed for mild pain.     albuterol (VENTOLIN HFA) 108 (90 Base) MCG/ACT inhaler Inhale 1-2 puffs into the lungs every 6 (six) hours as needed for wheezing or shortness of breath. 18 g 0   benzonatate (TESSALON) 200 MG capsule Take 1 capsule (200 mg total) by mouth 3 (three) times daily as needed for cough. 20 capsule 0     ROS Pertinent positives and negative per HPI, all others reviewed and negative  Physical Exam   BP 136/81   Pulse (!) 105   Temp 98.1 F (36.7 C) (Oral)   Resp 15   Ht 5\' 2"  (1.575 m)   Wt 90.3 kg   BMI 36.42 kg/m   Patient Vitals for the past 24 hrs:  BP Temp Temp src Pulse Resp SpO2 Height Weight  09/13/22 1954 136/81 -- -- (!) 105 -- -- -- --  09/13/22 1620 -- -- -- -- -- 97 % -- --  09/13/22 1536 136/79 98.1 F (36.7 C) Oral (!) 108 15 -- -- --  09/13/22 1525 -- -- -- -- -- -- 5\' 2"  (1.575 m) 90.3 kg    Physical Exam Vitals and nursing note reviewed.   Constitutional:      General: She is not in acute distress.    Appearance: Normal appearance.  HENT:     Head: Normocephalic and atraumatic.  Eyes:     General: No scleral icterus.    Conjunctiva/sclera: Conjunctivae normal.  Cardiovascular:     Rate and Rhythm: Regular rhythm. Tachycardia present.     Pulses: Normal pulses.  Pulmonary:     Effort: Pulmonary effort is normal. No respiratory distress.     Breath sounds: Normal breath sounds. No wheezing.  Chest:     Comments: Tender to palpation over left anterior ribs 8-10 Skin:    General: Skin is warm and dry.  Neurological:     Mental Status: She is alert.  Psychiatric:        Mood and Affect: Mood normal.        Behavior: Behavior normal.      FHT Baseline 150, moderate variability, 15x15 accels, no decels Toco: none Cat: 1   Labs Results for orders placed or performed during the hospital encounter of 09/13/22 (from the past 24 hour(s))  Urinalysis, Routine w reflex microscopic Urine, Clean Catch     Status: Abnormal   Collection Time: 09/13/22  3:39 PM  Result Value Ref Range   Color, Urine AMBER (A) YELLOW   APPearance HAZY (A) CLEAR   Specific Gravity, Urine 1.017 1.005 - 1.030   pH 6.0 5.0 - 8.0   Glucose, UA NEGATIVE NEGATIVE mg/dL   Hgb urine dipstick NEGATIVE NEGATIVE   Bilirubin Urine NEGATIVE NEGATIVE   Ketones, ur 5 (A) NEGATIVE mg/dL   Protein, ur 30 (A) NEGATIVE mg/dL   Nitrite NEGATIVE NEGATIVE   Leukocytes,Ua NEGATIVE NEGATIVE   RBC / HPF 0-5 0 - 5 RBC/hpf   WBC, UA 0-5 0 - 5 WBC/hpf   Bacteria, UA RARE (A) NONE SEEN   Squamous Epithelial / HPF 21-50 0 - 5 /HPF   Mucus PRESENT  CBC     Status: Abnormal   Collection Time: 09/13/22  4:46 PM  Result Value Ref Range   WBC 10.7 (H) 4.0 - 10.5 K/uL   RBC 3.72 (L) 3.87 - 5.11 MIL/uL   Hemoglobin 10.7 (L) 12.0 - 15.0 g/dL   HCT 32.1 (L) 36.0 - 46.0 %   MCV 86.3 80.0 - 100.0 fL   MCH 28.8 26.0 - 34.0 pg   MCHC 33.3 30.0 - 36.0 g/dL   RDW 15.3  11.5 - 15.5 %   Platelets 268 150 - 400 K/uL   nRBC 0.0 0.0 - 0.2 %  Comprehensive metabolic panel     Status: Abnormal   Collection Time: 09/13/22  4:46 PM  Result Value Ref Range   Sodium 132 (L) 135 - 145 mmol/L   Potassium 3.7 3.5 - 5.1 mmol/L   Chloride 102 98 - 111 mmol/L   CO2 19 (L) 22 - 32 mmol/L   Glucose, Bld 86 70 - 99 mg/dL   BUN <5 (L) 6 - 20 mg/dL   Creatinine, Ser 0.64 0.44 - 1.00 mg/dL   Calcium 8.8 (L) 8.9 - 10.3 mg/dL   Total Protein 6.6 6.5 - 8.1 g/dL   Albumin 2.6 (L) 3.5 - 5.0 g/dL   AST 24 15 - 41 U/L   ALT 32 0 - 44 U/L   Alkaline Phosphatase 184 (H) 38 - 126 U/L   Total Bilirubin 0.5 0.3 - 1.2 mg/dL   GFR, Estimated >60 >60 mL/min   Anion gap 11 5 - 15  Troponin I (High Sensitivity)     Status: None   Collection Time: 09/13/22  4:46 PM  Result Value Ref Range   Troponin I (High Sensitivity) 3 <18 ng/L    Imaging CT Angio Chest Pulmonary Embolism (PE) W or WO Contrast  Result Date: 09/13/2022 CLINICAL DATA:  Pregnant. Pulmonary embolism with persistent shortness of breath and cough. EXAM: CT ANGIOGRAPHY CHEST WITH CONTRAST TECHNIQUE: Multidetector CT imaging of the chest was performed using the standard protocol during bolus administration of intravenous contrast. Multiplanar CT image reconstructions and MIPs were obtained to evaluate the vascular anatomy. RADIATION DOSE REDUCTION: This exam was performed according to the departmental dose-optimization program which includes automated exposure control, adjustment of the mA and/or kV according to patient size and/or use of iterative reconstruction technique. CONTRAST:  25mL OMNIPAQUE IOHEXOL 350 MG/ML SOLN COMPARISON:  Chest x-ray 08/27/2022 and older FINDINGS: Cardiovascular: Heart is nonenlarged. There is pulsation artifact along the mediastinum including along the pulmonary arteries and aorta. Grossly normal course and caliber to the thoracic aorta.Diffuse breathing motion seen throughout the examination. This  can limit evaluation of small peripheral emboli. No large or central embolus identified. Mediastinum/Nodes: Normal caliber thoracic esophagus. There is no specific abnormal lymph node enlargement seen in the axillary region, hilum or mediastinum. Lungs/Pleura: Diffuse breathing motion. No pneumothorax or effusion. Minimal opacity seen along the middle lobe and dependent left lower lobe. Atelectasis is favored over infiltrate. Please correlate with symptoms. Upper Abdomen: Fatty liver infiltration seen in the upper abdomen. The adrenal glands are not included in the imaging field. Musculoskeletal: Mild degenerative changes along the spine. Review of the MIP images confirms the above findings. IMPRESSION: Significant breathing motion. This limits evaluation of emboli. No large and central embolus. Minimal opacity along the middle lobe and left lower lobe. Favor atelectasis over infiltrate. Fatty liver infiltration. Electronically Signed   By: Jill Side M.D.   On: 09/13/2022 19:01  MAU Course  Procedures Lab Orders         Urinalysis, Routine w reflex microscopic Urine, Clean Catch         CBC         Comprehensive metabolic panel    Meds ordered this encounter  Medications   acetaminophen (TYLENOL) tablet 1,000 mg   cyclobenzaprine (FLEXERIL) tablet 10 mg   iohexol (OMNIPAQUE) 350 MG/ML injection 75 mL   cyclobenzaprine (FLEXERIL) 10 MG tablet    Sig: Take 1 tablet (10 mg total) by mouth 3 (three) times daily as needed (rib pain).    Dispense:  30 tablet    Refill:  0    Order Specific Question:   Supervising Provider    Answer:   Venora Maples [[id number]]   Imaging Orders         CT Angio Chest Pulmonary Embolism (PE) W or WO Contrast         VAS [place] LOWER EXTREMITY VENOUS (DVT)      MDM Left rib pain exacerbated by coughing & shortness of breath after long course of respiratory illness. SpO2 97% & patient in no distress. Lung sounds clear.  EKG reviewed by Dr. Crissie Reese CT shows some  left atelectasis. No large or obvious embolus but some artifact due to breathing.  Dr. Crissie Reese recommends vascular study to r/o DVT to be performed outpatient. Order placed.   Constant pain resolved after tylenol & flexeril although patient continues to have some pain with cough. Will rx flexeril prn.   Given incentive spirometer & taught how to use by RN Assessment and Plan   1. Chest pain on breathing   2. Atelectasis of left lung   3. [redacted] weeks gestation of pregnancy    -F/u for outpatient vascular study tomorrow morning -Reviewed reasons to return to MAU -Rx flexeril prn -use incentive spirometer every few hours until SOB improves   Judeth Horn, NP 09/13/22 8:20 PM

## 2022-09-13 NOTE — MAU Note (Signed)
CT notified that patient's CMP has resulted. Caryl Pina in [place] stated patient would be placed in transport.

## 2022-09-13 NOTE — MAU Note (Addendum)
...  Pamela Bell is a 32 y.o. at [redacted]w[redacted]d here in MAU reporting: Constant sharp pain on the lateral left side of her upper ribcage. She reports she has had an ongoing cough for one month now and wonders if it is related. She reports she was tested here in MAU for COVID, Flu, and RSV and all were negative on 12/30. She reports this pain started in the middle of the night this past Sunday and was intermittent and occurred with movement, coughing, and sneezing prior to around midnight last night. She reports the pain has been constant since then and is sharp and tender to the touch. She reports the pain is "deep to shallow." Denies urinary symptoms. Urine dark in appearance. No CVA tenderness. Denies VB or LOF. +FM.  Onset of complaint:  Pain score:  5/10 lateral left upper rib - at rest  FHT: 160 initial external Lab orders placed from triage:  UA

## 2022-09-14 ENCOUNTER — Telehealth: Payer: Self-pay | Admitting: Family Medicine

## 2022-09-14 ENCOUNTER — Ambulatory Visit (HOSPITAL_COMMUNITY)
Admission: RE | Admit: 2022-09-14 | Discharge: 2022-09-14 | Disposition: A | Discharge status: Home / Self Care | Payer: 59 | Source: Ambulatory Visit | Attending: Family Medicine | Admitting: Family Medicine

## 2022-09-14 DIAGNOSIS — R071 Chest pain on breathing: Secondary | ICD-10-CM

## 2022-09-14 NOTE — Progress Notes (Signed)
VASCULAR LAB    Bilateral lower extremity venous duplex has been performed.  See CV proc for preliminary results.   Oline Belk, RVT 09/14/2022, 9:48 AM

## 2022-09-14 NOTE — Telephone Encounter (Signed)
Called patient, confirmed identity with two markers. I discussed prelim report of LE duplex study that showed no evidence of DVT bilaterally. She reports she is having ongoing pain. Reviewed that given neg troponin, neg CTPA, at this point most likely MSK/pleuritis in etiology. She is having trouble getting flexeril, [facility] in [place] does not have estimated time until it will be filled. Recommended she call back and see if another location has stock and transfer prescription.

## 2022-10-18 LAB — OB RESULTS CONSOLE GBS: GBS: NEGATIVE

## 2022-10-27 ENCOUNTER — Encounter (HOSPITAL_COMMUNITY): Payer: Self-pay | Admitting: Obstetrics and Gynecology

## 2022-10-27 ENCOUNTER — Inpatient Hospital Stay (HOSPITAL_COMMUNITY)
Admission: AD | Admit: 2022-10-27 | Discharge: 2022-10-27 | Disposition: A | Discharge status: Home / Self Care | Payer: 59 | Attending: Obstetrics and Gynecology | Admitting: Obstetrics and Gynecology

## 2022-10-27 DIAGNOSIS — Z3A38 38 weeks gestation of pregnancy: Secondary | ICD-10-CM

## 2022-10-27 DIAGNOSIS — O26893 Other specified pregnancy related conditions, third trimester: Secondary | ICD-10-CM | POA: Diagnosis present

## 2022-10-27 DIAGNOSIS — R519 Headache, unspecified: Secondary | ICD-10-CM | POA: Diagnosis present

## 2022-10-27 DIAGNOSIS — R030 Elevated blood-pressure reading, without diagnosis of hypertension: Secondary | ICD-10-CM | POA: Diagnosis present

## 2022-10-27 HISTORY — DX: Anemia, unspecified: D64.9

## 2022-10-27 HISTORY — DX: Headache, unspecified: R51.9

## 2022-10-27 HISTORY — DX: Urinary tract infection, site not specified: N39.0

## 2022-10-27 HISTORY — DX: Gestational diabetes mellitus in pregnancy, unspecified control: O24.419

## 2022-10-27 LAB — URINALYSIS, ROUTINE W REFLEX MICROSCOPIC
Glucose, UA: NEGATIVE mg/dL
Hgb urine dipstick: NEGATIVE
Ketones, ur: NEGATIVE mg/dL
Leukocytes,Ua: NEGATIVE
Nitrite: NEGATIVE
Protein, ur: NEGATIVE mg/dL
Specific Gravity, Urine: 1.01 (ref 1.005–1.030)
pH: 7.5 (ref 5.0–8.0)

## 2022-10-27 LAB — CBC
HCT: 31 % — ABNORMAL LOW (ref 36.0–46.0)
Hemoglobin: 10.2 g/dL — ABNORMAL LOW (ref 12.0–15.0)
MCH: 27.6 pg (ref 26.0–34.0)
MCHC: 32.9 g/dL (ref 30.0–36.0)
MCV: 84 fL (ref 80.0–100.0)
Platelets: 204 10*3/uL (ref 150–400)
RBC: 3.69 MIL/uL — ABNORMAL LOW (ref 3.87–5.11)
RDW: 14.7 % (ref 11.5–15.5)
WBC: 9.4 10*3/uL (ref 4.0–10.5)
nRBC: 0 % (ref 0.0–0.2)

## 2022-10-27 LAB — COMPREHENSIVE METABOLIC PANEL
ALT: 16 U/L (ref 0–44)
AST: 22 U/L (ref 15–41)
Albumin: 2.3 g/dL — ABNORMAL LOW (ref 3.5–5.0)
Alkaline Phosphatase: 201 U/L — ABNORMAL HIGH (ref 38–126)
Anion gap: 8 (ref 5–15)
BUN: <5 mg/dL — ABNORMAL LOW (ref 6–20)
CO2: 22 mmol/L (ref 22–32)
Calcium: 8.8 mg/dL — ABNORMAL LOW (ref 8.9–10.3)
Chloride: 104 mmol/L (ref 98–111)
Creatinine, Ser: 0.59 mg/dL (ref 0.44–1.00)
GFR, Estimated: >60 mL/min (ref >60)
Glucose, Bld: 123 mg/dL — ABNORMAL HIGH (ref 70–99)
Potassium: 3.6 mmol/L (ref 3.5–5.1)
Sodium: 134 mmol/L — ABNORMAL LOW (ref 135–145)
Total Bilirubin: 0.4 mg/dL (ref 0.3–1.2)
Total Protein: 6.1 g/dL — ABNORMAL LOW (ref 6.5–8.1)

## 2022-10-27 LAB — PROTEIN / CREATININE RATIO, URINE
Creatinine, Urine: 108 mg/dL
Protein Creatinine Ratio: 0.27 mg/mg{Cre} — ABNORMAL HIGH (ref 0.00–0.15)
Total Protein, Urine: 29 mg/dL

## 2022-10-27 MED ORDER — BUTALBITAL-APAP-CAFFEINE 50-325-40 MG PO TABS
2.0000 | ORAL_TABLET | Freq: Once | ORAL | Status: AC
  Administered 2022-10-27: 2 via ORAL
  Filled 2022-10-27: qty 2

## 2022-10-27 NOTE — Discharge Instructions (Signed)

## 2022-10-27 NOTE — MAU Note (Signed)
Pamela Bell is a 32 y.o. at 47w0dhere in MAU reporting: sent from dr's office for further eval.  BP elevated with HA.  Headache and nausea since last night. (Didn't take anything).  Reports decrease in fetal movement, though started feeling when placed on monitor here.   BP this morning was elevated.  Ate something, took daughter to school, repeated BP- still up 140's/90's.  Called and went to dr's office. Did have some blurry vision, but that went away.  Denies epigastric pain or increase in swelling. No bleeding or leaking.   Onset of complaint: last night Pain score: 4 Vitals:   10/27/22 1122  BP: 139/78  Pulse: (!) 109  Resp: 18  Temp: 98.1 F (36.7 C)  SpO2: 99%     FHT:160 Lab orders placed from triage:

## 2022-10-27 NOTE — MAU Provider Note (Signed)
History     CSN: [id number]  Arrival date and time: 10/27/22 1055   Event Date/Time   First Provider Initiated Contact with Patient 10/27/22 1133      Chief Complaint  Patient presents with   Headache   Hypertension   Decreased Fetal Movement   Nausea   HPI  Pamela Bell is a 32 y.o. G2P1001 at 74w0dwho presents for evaluation of elevated blood pressures. Patient reports she was seen in the office this morning and had 2 elevated blood pressures. She reports she had a headache at home. Patient rates the pain as a 4/10 and has not tried anything for the pain. She denies any visual changes or epigastric pain. She denies any vaginal bleeding, discharge, and leaking of fluid. Denies any constipation, diarrhea or any urinary complaints. Reports normal fetal movement.   OB History     Gravida  2   Para  1   Term  1   Preterm      AB      Living  1      SAB      IAB      Ectopic      Multiple  0   Live Births  1           Past Medical History:  Diagnosis Date   Anemia    Anxiety    Bilateral ovarian cysts    Depression    takes zoloft   Gestational diabetes    Headache    PONV (postoperative nausea and vomiting)    UTI (urinary tract infection)     Past Surgical History:  Procedure Laterality Date   ADENOIDECTOMY AND MYRINGOTOMY WITH TUBE PLACEMENT     EYE SURGERY      Family History  Problem Relation Age of Onset   Depression Mother    Anxiety disorder Mother    Hypertension Mother    Asthma Mother    Hypothyroidism Mother    Anxiety disorder Father    Asthma Father    Cancer Paternal Grandfather    ADD / ADHD Neg Hx    Alcohol abuse Neg Hx    Arthritis Neg Hx    Birth defects Neg Hx    COPD Neg Hx    Diabetes Neg Hx    Early death Neg Hx    Drug abuse Neg Hx    Hearing loss Neg Hx    Heart disease Neg Hx    Hyperlipidemia Neg Hx    Intellectual disability Neg Hx    Kidney disease Neg Hx    Learning disabilities Neg Hx     Miscarriages / Stillbirths Neg Hx    Obesity Neg Hx    Stroke Neg Hx    Vision loss Neg Hx    Varicose Veins Neg Hx     Social History   Tobacco Use   Smoking status: Never   Smokeless tobacco: Never   Tobacco comments:    Quit with preg, social- not regular/dly user  Vaping Use   Vaping Use: Former  Substance Use Topics   Alcohol use: Not Currently   Drug use: Never    Allergies:  Allergies  Allergen Reactions   Bee Venom Anaphylaxis   Cranberry Anaphylaxis   Augmentin [Amoxicillin-Pot Clavulanate]     As a child    Latex Rash   Red Dye Hives    No medications prior to admission.    Review of Systems  Constitutional: Negative.  Negative for fatigue and fever.  HENT: Negative.    Respiratory: Negative.  Negative for shortness of breath.   Cardiovascular: Negative.  Negative for chest pain.  Gastrointestinal: Negative.  Negative for abdominal pain, constipation, diarrhea, nausea and vomiting.  Genitourinary: Negative.  Negative for dysuria, vaginal bleeding and vaginal discharge.  Neurological:  Positive for headaches. Negative for dizziness.   Physical Exam   Blood pressure 124/82, pulse 98, temperature 98.1 F (36.7 C), temperature source Oral, resp. rate 18, height '5\' 2"'$  (1.575 m), weight 91.6 kg, SpO2 94 %.  Patient Vitals for the past 24 hrs:  BP Temp Temp src Pulse Resp SpO2 Height Weight  10/27/22 1331 124/82 -- -- 98 -- -- -- --  10/27/22 1330 -- -- -- -- -- 94 % -- --  10/27/22 1316 125/87 -- -- (!) 104 -- -- -- --  10/27/22 1305 -- -- -- -- -- 98 % -- --  10/27/22 1301 129/75 -- -- (!) 106 -- -- -- --  10/27/22 1246 138/76 -- -- (!) 102 -- -- -- --  10/27/22 1235 -- -- -- -- -- 96 % -- --  10/27/22 1231 134/77 -- -- 96 -- -- -- --  10/27/22 1216 138/80 -- -- 99 -- -- -- --  10/27/22 1210 -- -- -- -- -- 98 % -- --  10/27/22 1146 132/77 -- -- (!) 109 -- -- -- --  10/27/22 1133 131/81 -- -- (!) 101 -- -- -- --  10/27/22 1130 -- -- -- -- -- 97 % --  --  10/27/22 1122 139/78 98.1 F (36.7 C) Oral (!) 109 18 99 % -- --  10/27/22 1109 -- -- -- -- -- -- '5\' 2"'$  (1.575 m) 91.6 kg    Physical Exam Vitals and nursing note reviewed.  Constitutional:      General: She is not in acute distress.    Appearance: She is well-developed.  HENT:     Head: Normocephalic.  Eyes:     Pupils: Pupils are equal, round, and reactive to light.  Cardiovascular:     Rate and Rhythm: Normal rate and regular rhythm.     Heart sounds: Normal heart sounds.  Pulmonary:     Effort: Pulmonary effort is normal. No respiratory distress.     Breath sounds: Normal breath sounds.  Abdominal:     General: Bowel sounds are normal. There is no distension.     Palpations: Abdomen is soft.     Tenderness: There is no abdominal tenderness.  Skin:    General: Skin is warm and dry.  Neurological:     Mental Status: She is alert and oriented to person, place, and time.  Psychiatric:        Mood and Affect: Mood normal.        Behavior: Behavior normal.        Thought Content: Thought content normal.        Judgment: Judgment normal.     Fetal Tracing:  Baseline: 145  Variability: moderate Accels: 15x15 Decels: none  Toco: none      MAU Course  Procedures  Results for orders placed or performed during the hospital encounter of 10/27/22 (from the past 24 hour(s))  Protein / creatinine ratio, urine     Status: Abnormal   Collection Time: 10/27/22 11:33 AM  Result Value Ref Range   Creatinine, Urine 108 mg/dL   Total Protein, Urine 29 mg/dL   Protein Creatinine Ratio 0.27 (H) 0.00 - 0.15 mg/mg[Cre]  Urinalysis, Routine w reflex microscopic -Urine, Clean Catch     Status: Abnormal   Collection Time: 10/27/22 11:33 AM  Result Value Ref Range   Color, Urine YELLOW YELLOW   APPearance CLEAR CLEAR   Specific Gravity, Urine 1.010 1.005 - 1.030   pH 7.5 5.0 - 8.0   Glucose, UA NEGATIVE NEGATIVE mg/dL   Hgb urine dipstick NEGATIVE NEGATIVE   Bilirubin  Urine SMALL (A) NEGATIVE   Ketones, ur NEGATIVE NEGATIVE mg/dL   Protein, ur NEGATIVE NEGATIVE mg/dL   Nitrite NEGATIVE NEGATIVE   Leukocytes,Ua NEGATIVE NEGATIVE  CBC     Status: Abnormal   Collection Time: 10/27/22 11:46 AM  Result Value Ref Range   WBC 9.4 4.0 - 10.5 K/uL   RBC 3.69 (L) 3.87 - 5.11 MIL/uL   Hemoglobin 10.2 (L) 12.0 - 15.0 g/dL   HCT 31.0 (L) 36.0 - 46.0 %   MCV 84.0 80.0 - 100.0 fL   MCH 27.6 26.0 - 34.0 pg   MCHC 32.9 30.0 - 36.0 g/dL   RDW 14.7 11.5 - 15.5 %   Platelets 204 150 - 400 K/uL   nRBC 0.0 0.0 - 0.2 %  Comprehensive metabolic panel     Status: Abnormal   Collection Time: 10/27/22 11:46 AM  Result Value Ref Range   Sodium 134 (L) 135 - 145 mmol/L   Potassium 3.6 3.5 - 5.1 mmol/L   Chloride 104 98 - 111 mmol/L   CO2 22 22 - 32 mmol/L   Glucose, Bld 123 (H) 70 - 99 mg/dL   BUN <5 (L) 6 - 20 mg/dL   Creatinine, Ser 0.59 0.44 - 1.00 mg/dL   Calcium 8.8 (L) 8.9 - 10.3 mg/dL   Total Protein 6.1 (L) 6.5 - 8.1 g/dL   Albumin 2.3 (L) 3.5 - 5.0 g/dL   AST 22 15 - 41 U/L   ALT 16 0 - 44 U/L   Alkaline Phosphatase 201 (H) 38 - 126 U/L   Total Bilirubin 0.4 0.3 - 1.2 mg/dL   GFR, Estimated >60 >60 mL/min   Anion gap 8 5 - 15     MDM Prenatal records from community office reviewed.  Labs ordered and reviewed.   UA CBC, CMP, Protein/creat ratio Fioricet PO- HA improved with medication  CNM notified of patient arrival due to new onset HTN at term from office but normotensive in MAU- recommends BP check in office Monday  Assessment and Plan   1. Pregnancy headache in third trimester   2. [redacted] weeks gestation of pregnancy     -Discharge home in stable condition -Preeclampsia precautions discussed -Patient advised to follow-up with OB on Monday for BP check -Patient may return to MAU as needed or if her condition were to change or worsen  Wende Mott, CNM 10/27/2022, 11:34 AM

## 2022-10-28 ENCOUNTER — Inpatient Hospital Stay (HOSPITAL_COMMUNITY)
Admission: AD | Admit: 2022-10-28 | Discharge: 2022-10-28 | Disposition: A | Discharge status: Home / Self Care | Payer: 59 | Attending: Obstetrics | Admitting: Obstetrics

## 2022-10-28 ENCOUNTER — Other Ambulatory Visit: Payer: Self-pay

## 2022-10-28 ENCOUNTER — Encounter (HOSPITAL_COMMUNITY): Payer: Self-pay | Admitting: Obstetrics

## 2022-10-28 DIAGNOSIS — O99353 Diseases of the nervous system complicating pregnancy, third trimester: Secondary | ICD-10-CM | POA: Diagnosis not present

## 2022-10-28 DIAGNOSIS — O26893 Other specified pregnancy related conditions, third trimester: Secondary | ICD-10-CM | POA: Diagnosis not present

## 2022-10-28 DIAGNOSIS — R519 Headache, unspecified: Secondary | ICD-10-CM

## 2022-10-28 DIAGNOSIS — Z79899 Other long term (current) drug therapy: Secondary | ICD-10-CM | POA: Insufficient documentation

## 2022-10-28 DIAGNOSIS — G43909 Migraine, unspecified, not intractable, without status migrainosus: Secondary | ICD-10-CM | POA: Insufficient documentation

## 2022-10-28 DIAGNOSIS — R030 Elevated blood-pressure reading, without diagnosis of hypertension: Secondary | ICD-10-CM | POA: Diagnosis present

## 2022-10-28 DIAGNOSIS — Z3689 Encounter for other specified antenatal screening: Secondary | ICD-10-CM

## 2022-10-28 DIAGNOSIS — O24415 Gestational diabetes mellitus in pregnancy, controlled by oral hypoglycemic drugs: Secondary | ICD-10-CM | POA: Diagnosis not present

## 2022-10-28 DIAGNOSIS — Z1152 Encounter for screening for COVID-19: Secondary | ICD-10-CM | POA: Insufficient documentation

## 2022-10-28 DIAGNOSIS — Z3A38 38 weeks gestation of pregnancy: Secondary | ICD-10-CM | POA: Insufficient documentation

## 2022-10-28 LAB — COMPREHENSIVE METABOLIC PANEL
ALT: 18 U/L (ref 0–44)
AST: 24 U/L (ref 15–41)
Albumin: 2.3 g/dL — ABNORMAL LOW (ref 3.5–5.0)
Alkaline Phosphatase: 196 U/L — ABNORMAL HIGH (ref 38–126)
Anion gap: 12 (ref 5–15)
BUN: <5 mg/dL — ABNORMAL LOW (ref 6–20)
CO2: 20 mmol/L — ABNORMAL LOW (ref 22–32)
Calcium: 8.7 mg/dL — ABNORMAL LOW (ref 8.9–10.3)
Chloride: 103 mmol/L (ref 98–111)
Creatinine, Ser: 0.67 mg/dL (ref 0.44–1.00)
GFR, Estimated: >60 mL/min (ref >60)
Glucose, Bld: 109 mg/dL — ABNORMAL HIGH (ref 70–99)
Potassium: 3.7 mmol/L (ref 3.5–5.1)
Sodium: 135 mmol/L (ref 135–145)
Total Bilirubin: 0.6 mg/dL (ref 0.3–1.2)
Total Protein: 5.8 g/dL — ABNORMAL LOW (ref 6.5–8.1)

## 2022-10-28 LAB — CBC
HCT: 30 % — ABNORMAL LOW (ref 36.0–46.0)
Hemoglobin: 10.1 g/dL — ABNORMAL LOW (ref 12.0–15.0)
MCH: 28.2 pg (ref 26.0–34.0)
MCHC: 33.7 g/dL (ref 30.0–36.0)
MCV: 83.8 fL (ref 80.0–100.0)
Platelets: 197 10*3/uL (ref 150–400)
RBC: 3.58 MIL/uL — ABNORMAL LOW (ref 3.87–5.11)
RDW: 14.7 % (ref 11.5–15.5)
WBC: 8.7 10*3/uL (ref 4.0–10.5)
nRBC: 0 % (ref 0.0–0.2)

## 2022-10-28 LAB — PROTEIN / CREATININE RATIO, URINE
Creatinine, Urine: 155 mg/dL
Protein Creatinine Ratio: 0.28 mg/mg{Cre} — ABNORMAL HIGH (ref 0.00–0.15)
Total Protein, Urine: 44 mg/dL

## 2022-10-28 LAB — RESP PANEL BY RT-PCR (RSV, FLU A&B, COVID)  RVPGX2
Influenza A by PCR: NEGATIVE
Influenza B by PCR: NEGATIVE
Resp Syncytial Virus by PCR: NEGATIVE
SARS Coronavirus 2 by RT PCR: NEGATIVE

## 2022-10-28 MED ORDER — METFORMIN HCL 500 MG PO TABS
500.0000 mg | ORAL_TABLET | Freq: Once | ORAL | Status: AC
  Administered 2022-10-28: 500 mg via ORAL
  Filled 2022-10-28: qty 1

## 2022-10-28 MED ORDER — CYCLOBENZAPRINE HCL 5 MG PO TABS
10.0000 mg | ORAL_TABLET | Freq: Once | ORAL | Status: AC
  Administered 2022-10-28: 10 mg via ORAL
  Filled 2022-10-28: qty 2

## 2022-10-28 NOTE — Discharge Instructions (Signed)

## 2022-10-28 NOTE — MAU Note (Signed)
Pamela Bell is a 32 y.o. at 21w1dhere in MAU reporting: she's here for BP evaluation.  Reports currently has HA and "foggy vision".  Reports took Tylenol 500 mg x2 @ 0609, no relief noted.  Denies epigastric pain.  Has taken several BP's this morning.  BP's 144/89, 143/83, 138/79, 138/86, 132/84, 145/87. Denies VB or LOF.  Endorses +FM. LMP: NA Onset of complaint: today Pain score: 6 Vitals:   10/28/22 1009  BP: 134/76  Pulse: 95  Resp: 18  Temp: 97.7 F (36.5 C)  SpO2: 98%     FHT:138 bpm Lab orders placed from triage:   UA

## 2022-10-28 NOTE — MAU Provider Note (Signed)
History     CSN: [id number]  Arrival date and time: 10/28/22 0951   None     Chief Complaint  Patient presents with   BP Evaluation   HPI Pamela Bell is a 32 y.o. G2P1001 at 70w1dwho presents to MAU with chief complaint of elevated blood pressures on her home cuff. This is a recurrent problem for which patient was evaluated in MAU yesterday 10/27/2022.  Patient endorses mildly elevated readings on her home cuff. She did not bring her cuff to MAU.  Patient endorses feeling "foggy" in addition to persistent headache not improved by 1G Tylenol taken at 0600. She reports spots in her field of vision, new onset this morning. Pain score is 6/10 on arrival to MAU.   Patient's pregnancy is c/b A2GDM. She reports breakfast of mini muffins this morning. She has not taken her morning dose of Metformin 500 mg.  [facility] states she received a phone call early last week that she had Preeclampsia based on her lab work. She did not have elevated blood pressures until yesterday and states she did not have any discussions about delivery timing or direct admission for Preeclampsia with her OB team.  OB History     Gravida  2   Para  1   Term  1   Preterm      AB      Living  1      SAB      IAB      Ectopic      Multiple  0   Live Births  1           Past Medical History:  Diagnosis Date   Anemia    Anxiety    Bilateral ovarian cysts    Depression    takes zoloft   Gestational diabetes    Headache    PONV (postoperative nausea and vomiting)    UTI (urinary tract infection)     Past Surgical History:  Procedure Laterality Date   ADENOIDECTOMY AND MYRINGOTOMY WITH TUBE PLACEMENT     EYE SURGERY      Family History  Problem Relation Age of Onset   Depression Mother    Anxiety disorder Mother    Hypertension Mother    Asthma Mother    Hypothyroidism Mother    Anxiety disorder Father    Asthma Father    Cancer  Paternal Grandfather    ADD / ADHD Neg Hx    Alcohol abuse Neg Hx    Arthritis Neg Hx    Birth defects Neg Hx    COPD Neg Hx    Diabetes Neg Hx    Early death Neg Hx    Drug abuse Neg Hx    Hearing loss Neg Hx    Heart disease Neg Hx    Hyperlipidemia Neg Hx    Intellectual disability Neg Hx    Kidney disease Neg Hx    Learning disabilities Neg Hx    Miscarriages / Stillbirths Neg Hx    Obesity Neg Hx    Stroke Neg Hx    Vision loss Neg Hx    Varicose Veins Neg Hx     Social History   Tobacco Use   Smoking status: Never   Smokeless tobacco: Never   Tobacco comments:    Quit with preg, social- not regular/dly user  Vaping Use   Vaping Use: Former  Substance Use Topics   Alcohol use:  Not Currently   Drug use: Never    Allergies:  Allergies  Allergen Reactions   Bee Venom Anaphylaxis   Cranberry Anaphylaxis   Augmentin [Amoxicillin-Pot Clavulanate]     As a child    Latex Rash   Red Dye Hives    Medications Prior to Admission  Medication Sig Dispense Refill Last Dose   ACCU-CHEK GUIDE test strip 4 (four) times daily.      Accu-Chek Softclix Lancets lancets SMARTSIG:2 Topical Twice Daily      acetaminophen (TYLENOL) 500 MG tablet Take 1,000 mg by mouth every 6 (six) hours as needed for mild pain.      albuterol (VENTOLIN HFA) 108 (90 Base) MCG/ACT inhaler Inhale 1-2 puffs into the lungs every 6 (six) hours as needed for wheezing or shortness of breath. 18 g 0    benzonatate (TESSALON) 200 MG capsule Take 1 capsule (200 mg total) by mouth 3 (three) times daily as needed for cough. 20 capsule 0    Blood Glucose Monitoring Suppl (ACCU-CHEK GUIDE ME) w/Device KIT 4 (four) times daily.      cyclobenzaprine (FLEXERIL) 10 MG tablet Take 1 tablet (10 mg total) by mouth 3 (three) times daily as needed (rib pain). 30 tablet 0    Ferrous Sulfate (IRON PO) Take by mouth.      loratadine (CLARITIN) 10 MG tablet Take 10 mg by mouth daily.      metFORMIN (GLUCOPHAGE) 500 MG  tablet Take 500 mg by mouth 2 (two) times daily with a meal.      metoCLOPramide (REGLAN) 10 MG tablet Take 10 mg by mouth 4 (four) times daily.      pantoprazole (PROTONIX) 40 MG tablet Take 40 mg by mouth daily.      Prenatal Vit-Fe Fumarate-FA (PRENATAL MULTIVITAMIN) TABS tablet Take 1 tablet by mouth daily at 12 noon.      sertraline (ZOLOFT) 50 MG tablet Take 75 mg by mouth daily.       Review of Systems  Eyes:  Positive for visual disturbance.  Neurological:  Positive for headaches.  All other systems reviewed and are negative.  Physical Exam   Blood pressure 134/76, pulse 95, temperature 97.7 F (36.5 C), temperature source Oral, resp. rate 18, height '5\' 2"'$  (1.575 m), weight 91.3 kg, SpO2 98 %.  Physical Exam Vitals and nursing note reviewed. Exam conducted with a chaperone present.  Constitutional:      Appearance: Normal appearance.  Pulmonary:     Effort: Pulmonary effort is normal.     Breath sounds: Normal breath sounds.  Abdominal:     Comments: Gravid  Skin:    Capillary Refill: Capillary refill takes less than 2 seconds.  Neurological:     Mental Status: She is alert and oriented to person, place, and time.  Psychiatric:        Mood and Affect: Mood normal.        Behavior: Behavior normal.        Thought Content: Thought content normal.        Judgment: Judgment normal.     MAU Course  Procedures  MDM  --Reactive tracing: baseline 145, mod var, + accels, no decels --Toco: quiet --Dr. Carlis Abbott on call for [facility], called by CNM at 1041. Discussed patient report of being diagnosed with Preeclampsia. Office labs reviewed by Dr. Carlis Abbott, who states P:Cr was 0.25, no active diagnosis of PEC in office records. Will plan to contact Dr. Carlis Abbott if today's labs are abnormal, otherwise  discharge home with strict precautions, patient to keep appointment for Monday  Orders Placed This Encounter  Procedures   Resp panel by RT-PCR (RSV, Flu A&B, Covid) Anterior Nasal  Swab   Protein / creatinine ratio, urine   CBC   Comprehensive metabolic panel   Measure blood pressure   Airborne and Contact precautions   Discharge patient   Patient Vitals for the past 24 hrs:  BP Temp Temp src Pulse Resp SpO2 Height Weight  10/28/22 1131 110/65 -- -- 97 -- -- -- --  10/28/22 1117 119/79 -- -- (!) 101 -- -- -- --  10/28/22 1103 127/74 -- -- 93 -- -- -- --  10/28/22 1030 128/77 -- -- 95 -- -- -- --  10/28/22 1028 128/83 -- -- (!) 102 -- -- -- --  10/28/22 1009 134/76 97.7 F (36.5 C) Oral 95 18 98 % -- --  10/28/22 1003 -- -- -- -- -- -- '5\' 2"'$  (1.575 m) 201 lb 4.8 oz (91.3 kg)   Results for orders placed or performed during the hospital encounter of 10/28/22 (from the past 24 hour(s))  Protein / creatinine ratio, urine     Status: Abnormal   Collection Time: 10/28/22 10:15 AM  Result Value Ref Range   Creatinine, Urine 155 mg/dL   Total Protein, Urine 44 mg/dL   Protein Creatinine Ratio 0.28 (H) 0.00 - 0.15 mg/mg[Cre]  CBC     Status: Abnormal   Collection Time: 10/28/22 10:30 AM  Result Value Ref Range   WBC 8.7 4.0 - 10.5 K/uL   RBC 3.58 (L) 3.87 - 5.11 MIL/uL   Hemoglobin 10.1 (L) 12.0 - 15.0 g/dL   HCT 30.0 (L) 36.0 - 46.0 %   MCV 83.8 80.0 - 100.0 fL   MCH 28.2 26.0 - 34.0 pg   MCHC 33.7 30.0 - 36.0 g/dL   RDW 14.7 11.5 - 15.5 %   Platelets 197 150 - 400 K/uL   nRBC 0.0 0.0 - 0.2 %  Comprehensive metabolic panel     Status: Abnormal   Collection Time: 10/28/22 10:30 AM  Result Value Ref Range   Sodium 135 135 - 145 mmol/L   Potassium 3.7 3.5 - 5.1 mmol/L   Chloride 103 98 - 111 mmol/L   CO2 20 (L) 22 - 32 mmol/L   Glucose, Bld 109 (H) 70 - 99 mg/dL   BUN <5 (L) 6 - 20 mg/dL   Creatinine, Ser 0.67 0.44 - 1.00 mg/dL   Calcium 8.7 (L) 8.9 - 10.3 mg/dL   Total Protein 5.8 (L) 6.5 - 8.1 g/dL   Albumin 2.3 (L) 3.5 - 5.0 g/dL   AST 24 15 - 41 U/L   ALT 18 0 - 44 U/L   Alkaline Phosphatase 196 (H) 38 - 126 U/L   Total Bilirubin 0.6 0.3 - 1.2  mg/dL   GFR, Estimated >60 >60 mL/min   Anion gap 12 5 - 15  Resp panel by RT-PCR (RSV, Flu A&B, Covid) Anterior Nasal Swab     Status: None   Collection Time: 10/28/22 11:05 AM   Specimen: Anterior Nasal Swab  Result Value Ref Range   SARS Coronavirus 2 by RT PCR NEGATIVE NEGATIVE   Influenza A by PCR NEGATIVE NEGATIVE   Influenza B by PCR NEGATIVE NEGATIVE   Resp Syncytial Virus by PCR NEGATIVE NEGATIVE   Meds ordered this encounter  Medications   cyclobenzaprine (FLEXERIL) tablet 10 mg    Patient has previously taken without reaction  metFORMIN (GLUCOPHAGE) tablet 500 mg   Assessment and Plan  --32 y.o. G2P1001 at [redacted]w[redacted]d --Migraine headache in pregnancy, responsive to PO medications --Normotensive in MAU, elevated in office 10/27/2022 --PEC labs WNL --Reactive tracing --Patient declined additional migraine management --Care coordinated with Dr. CCarlis Abbott--Discharge home in stable condition with strict return precautions  F/U: --Patient has an office appointment Monday 10/30/2022  [place]arlina Rumpf [place] MSN, CNM 10/28/2022, 3:23 PM

## 2022-11-04 ENCOUNTER — Encounter (HOSPITAL_COMMUNITY): Payer: Self-pay | Admitting: Obstetrics and Gynecology

## 2022-11-04 ENCOUNTER — Inpatient Hospital Stay (HOSPITAL_COMMUNITY)
Admission: AD | Admit: 2022-11-04 | Discharge: 2022-11-09 | DRG: 787 | Disposition: A | Discharge status: Home / Self Care | Payer: 59 | Attending: Obstetrics and Gynecology | Admitting: Obstetrics and Gynecology

## 2022-11-04 DIAGNOSIS — O9902 Anemia complicating childbirth: Secondary | ICD-10-CM | POA: Diagnosis present

## 2022-11-04 DIAGNOSIS — O133 Gestational [pregnancy-induced] hypertension without significant proteinuria, third trimester: Principal | ICD-10-CM

## 2022-11-04 DIAGNOSIS — R030 Elevated blood-pressure reading, without diagnosis of hypertension: Secondary | ICD-10-CM | POA: Diagnosis present

## 2022-11-04 DIAGNOSIS — O99214 Obesity complicating childbirth: Secondary | ICD-10-CM | POA: Diagnosis present

## 2022-11-04 DIAGNOSIS — O99344 Other mental disorders complicating childbirth: Secondary | ICD-10-CM | POA: Diagnosis present

## 2022-11-04 DIAGNOSIS — O24424 Gestational diabetes mellitus in childbirth, insulin controlled: Secondary | ICD-10-CM | POA: Diagnosis not present

## 2022-11-04 DIAGNOSIS — O24425 Gestational diabetes mellitus in childbirth, controlled by oral hypoglycemic drugs: Secondary | ICD-10-CM | POA: Diagnosis not present

## 2022-11-04 DIAGNOSIS — F32A Depression, unspecified: Secondary | ICD-10-CM | POA: Diagnosis present

## 2022-11-04 DIAGNOSIS — Z3A39 39 weeks gestation of pregnancy: Secondary | ICD-10-CM | POA: Diagnosis not present

## 2022-11-04 DIAGNOSIS — O2492 Unspecified diabetes mellitus in childbirth: Secondary | ICD-10-CM | POA: Diagnosis not present

## 2022-11-04 DIAGNOSIS — D62 Acute posthemorrhagic anemia: Secondary | ICD-10-CM | POA: Diagnosis not present

## 2022-11-04 DIAGNOSIS — O139 Gestational [pregnancy-induced] hypertension without significant proteinuria, unspecified trimester: Principal | ICD-10-CM | POA: Diagnosis present

## 2022-11-04 DIAGNOSIS — Z3A00 Weeks of gestation of pregnancy not specified: Secondary | ICD-10-CM | POA: Diagnosis not present

## 2022-11-04 DIAGNOSIS — F419 Anxiety disorder, unspecified: Secondary | ICD-10-CM | POA: Diagnosis present

## 2022-11-04 DIAGNOSIS — O134 Gestational [pregnancy-induced] hypertension without significant proteinuria, complicating childbirth: Secondary | ICD-10-CM | POA: Diagnosis not present

## 2022-11-04 LAB — PROTEIN / CREATININE RATIO, URINE
Creatinine, Urine: 43 mg/dL
Protein Creatinine Ratio: 0.16 mg/mg{Cre} — ABNORMAL HIGH (ref 0.00–0.15)
Total Protein, Urine: 7 mg/dL

## 2022-11-04 LAB — CBC
HCT: 32.4 % — ABNORMAL LOW (ref 36.0–46.0)
Hemoglobin: 10.6 g/dL — ABNORMAL LOW (ref 12.0–15.0)
MCH: 27.6 pg (ref 26.0–34.0)
MCHC: 32.7 g/dL (ref 30.0–36.0)
MCV: 84.4 fL (ref 80.0–100.0)
Platelets: 246 10*3/uL (ref 150–400)
RBC: 3.84 MIL/uL — ABNORMAL LOW (ref 3.87–5.11)
RDW: 14.6 % (ref 11.5–15.5)
WBC: 10.4 10*3/uL (ref 4.0–10.5)
nRBC: 0 % (ref 0.0–0.2)

## 2022-11-04 LAB — COMPREHENSIVE METABOLIC PANEL
ALT: 15 U/L (ref 0–44)
AST: 20 U/L (ref 15–41)
Albumin: 2.5 g/dL — ABNORMAL LOW (ref 3.5–5.0)
Alkaline Phosphatase: 223 U/L — ABNORMAL HIGH (ref 38–126)
Anion gap: 16 — ABNORMAL HIGH (ref 5–15)
BUN: 6 mg/dL (ref 6–20)
CO2: 17 mmol/L — ABNORMAL LOW (ref 22–32)
Calcium: 9.6 mg/dL (ref 8.9–10.3)
Chloride: 103 mmol/L (ref 98–111)
Creatinine, Ser: 0.73 mg/dL (ref 0.44–1.00)
GFR, Estimated: >60 mL/min (ref >60)
Glucose, Bld: 92 mg/dL (ref 70–99)
Potassium: 4.1 mmol/L (ref 3.5–5.1)
Sodium: 136 mmol/L (ref 135–145)
Total Bilirubin: 0.4 mg/dL (ref 0.3–1.2)
Total Protein: 6.3 g/dL — ABNORMAL LOW (ref 6.5–8.1)

## 2022-11-04 LAB — TYPE AND SCREEN
ABO/RH(D): O POS
Antibody Screen: NEGATIVE

## 2022-11-04 MED ORDER — LACTATED RINGERS IV SOLN: INTRAVENOUS | Status: DC

## 2022-11-04 MED ORDER — OXYTOCIN-SODIUM CHLORIDE 30-0.9 UT/500ML-% IV SOLN
2.5000 [IU]/h | INTRAVENOUS | Status: DC
  Filled 2022-11-04 (×2): qty 500

## 2022-11-04 MED ORDER — ACETAMINOPHEN 325 MG PO TABS: 650.0000 mg | ORAL_TABLET | ORAL | Status: DC | PRN

## 2022-11-04 MED ORDER — SOD CITRATE-CITRIC ACID 500-334 MG/5ML PO SOLN
30.0000 mL | ORAL | Status: DC | PRN
  Filled 2022-11-04: qty 30

## 2022-11-04 MED ORDER — LIDOCAINE HCL (PF) 1 % IJ SOLN: 30.0000 mL | INTRAMUSCULAR | Status: DC | PRN

## 2022-11-04 MED ORDER — LACTATED RINGERS IV SOLN: 500.0000 mL | INTRAVENOUS | Status: DC | PRN

## 2022-11-04 MED ORDER — OXYTOCIN BOLUS FROM INFUSION: 333.0000 mL | Freq: Once | INTRAVENOUS | Status: DC

## 2022-11-04 MED ORDER — ONDANSETRON HCL 4 MG/2ML IJ SOLN
4.0000 mg | Freq: Four times a day (QID) | INTRAMUSCULAR | Status: DC | PRN
  Administered 2022-11-05: 4 mg via INTRAVENOUS
  Filled 2022-11-04: qty 2

## 2022-11-04 NOTE — MAU Note (Signed)
Pamela Bell is a 32 y.o. at 57w1dhere in MAU reporting:checking BP at home, keeps rising 156/94 was the last one.  Has not been below the 130's.  No headache,  is seeing colorful little dots, denies epigastric pain or increased swelling. (Swelling comes and goes).  No bleeding, feels like something is always leaking out, ? Pee or fluid.  Lots of pressure and cramping. Feeling off today.   Onset of complaint: ongoing Pain score: 6 Vitals:   11/04/22 1727  BP: (!) 141/91  Pulse: (!) 118  Resp: 18  Temp: 98 F (36.7 C)  SpO2: 99%     FHT:173 Lab orders placed from triage:  urine obtained

## 2022-11-04 NOTE — MAU Note (Signed)
Had a BP of 156/94 was last one she checked around 4:15. Denies HA and RUQ pain but does states she has some spots in vision.

## 2022-11-04 NOTE — MAU Provider Note (Addendum)
Event Date/Time   First Provider Initiated Contact with Patient 11/04/22 1756      S Ms. Pamela Bell is a 32 y.o. G2P1001 patient who presents to MAU today for evaluation of worsening HTN on her home cuff. Patient also reports "rainbow dots" in her field of vision. She denies headache, RUQ/epigastric pain, new onset swelling or weight gain.   Patient receives care with [facility]. Pregnancy complicated by [id number] (Metformin).  O BP 135/78   Pulse (!) 104   Temp 98 F (36.7 C) (Oral)   Resp 18   Ht '5\' 2"'$  (1.575 m)   Wt 91.5 kg   SpO2 99%   BMI 36.91 kg/m   Patient Vitals for the past 24 hrs:  BP Temp Temp src Pulse Resp SpO2 Height Weight  11/04/22 1816 135/78 -- -- (!) 104 18 -- -- --  11/04/22 1801 (!) 145/86 -- -- (!) 114 18 -- -- --  11/04/22 1746 (!) 147/89 -- -- 98 18 -- -- --  11/04/22 1744 (!) 149/88 -- -- (!) 103 18 -- -- --  11/04/22 1727 (!) 141/91 98 F (36.7 C) Oral (!) 118 18 99 % '5\' 2"'$  (1.575 m) 91.5 kg    Physical Exam Vitals and nursing note reviewed. Exam conducted with a chaperone present.  Cardiovascular:     Rate and Rhythm: Normal rate.  Pulmonary:     Effort: Pulmonary effort is normal.  Abdominal:     Comments: Gravid  Skin:    General: Skin is warm and dry.    A --Medical screening exam complete --Cat I tracing: baseline 150, mod var, + accels, no decels --Toco: quiet --GBS neg --A2GDM (Metformin) --Documented elevated BP in office one week ago (visible in Media) --Now consistently mildly elevated BPs in MAU --Meets criteria for Gestational Hypertension, P:Cr 0.16, CBC and CMET in work   P Per Dr. Brien Mates, admit to L&D for IOL  Mallie Snooks, MSA, MSN, CNM Certified Nurse Midwife, Faculty Practice  I provided general supervision for this patient and was immediately available for any patient care concern. [employer], DO

## 2022-11-04 NOTE — H&P (Signed)
Pamela Bell is a 32 y.o. female G2P1001 79w1dpresenting for elevated blood pressures at home. She reports no LOF, VB, Contractions. Normal FM. She endorses "rainbow dots" in her vision but denies headache, vision changes.   In MAU, Bps 141/91, 149/88, 147/89, 145/86.  On chart review, patient had elevated blood pressures in office on 3/1 (140/92, 136/92).    Pregnancy c/b: A2GDM: on metformin '500mg'$  BID, 34 week growth scan: 5lbs 7oz, 48%ile Anxiety/depression: stable on zoloft '75mg'$ .   OB History     Gravida  2   Para  1   Term  1   Preterm      AB      Living  1      SAB      IAB      Ectopic      Multiple  0   Live Births  1          Past Medical History:  Diagnosis Date   Anemia    Anxiety    Bilateral ovarian cysts    Depression    takes zoloft   Gestational diabetes    Headache    PONV (postoperative nausea and vomiting)    UTI (urinary tract infection)    Past Surgical History:  Procedure Laterality Date   ADENOIDECTOMY AND MYRINGOTOMY WITH TUBE PLACEMENT     EYE SURGERY     Family History: family history includes Anxiety disorder in her father and mother; Asthma in her father and mother; Cancer in her paternal grandfather; Depression in her mother; Hypertension in her mother; Hypothyroidism in her mother. Social History:  reports that she has never smoked. She has never used smokeless tobacco. She reports that she does not currently use alcohol. She reports that she does not use drugs.     Maternal Diabetes: Yes:  Diabetes Type:  Insulin/Medication controlled Genetic Screening: Normal Maternal Ultrasounds/Referrals: Normal Fetal Ultrasounds or other Referrals:  None Maternal Substance Abuse:  No Significant Maternal Medications:  Meds include: Zoloft Significant Maternal Lab Results:  Group B Strep negative Other Comments:  None  Review of Systems Per HPI Exam Physical Exam    Blood pressure 126/73, pulse 99, temperature 98 F  (36.7 C), temperature source Oral, resp. rate 18, height '5\' 2"'$  (1.575 m), weight 91.5 kg, SpO2 99 %. Gen: NAD, resting comfortably CVS: normal pulses Lungs: Nonlabored respirations Abd: Gravid abdomen Ext; no calf edema or tenderness  Fetal testing: ***  CBC    Component Value Date/Time   WBC 10.4 11/04/2022 1736   RBC 3.84 (L) 11/04/2022 1736   HGB 10.6 (L) 11/04/2022 1736   HCT 32.4 (L) 11/04/2022 1736   PLT 246 11/04/2022 1736   MCV 84.4 11/04/2022 1736   MCH 27.6 11/04/2022 1736   MCHC 32.7 11/04/2022 1736   RDW 14.6 11/04/2022 1736   LYMPHSABS 1.4 08/27/2019 0727   MONOABS 0.2 08/27/2019 0727   EOSABS 0.0 08/27/2019 0727   BASOSABS 0.0 08/27/2019 0727   CMP     Component Value Date/Time   NA 136 11/04/2022 1736   K 4.1 11/04/2022 1736   CL 103 11/04/2022 1736   CO2 17 (L) 11/04/2022 1736   GLUCOSE 92 11/04/2022 1736   BUN 6 11/04/2022 1736   CREATININE 0.73 11/04/2022 1736   CALCIUM 9.6 11/04/2022 1736   PROT 6.3 (L) 11/04/2022 1736   ALBUMIN 2.5 (L) 11/04/2022 1736   AST 20 11/04/2022 1736   ALT 15 11/04/2022 1736   ALKPHOS 223 (  H) 11/04/2022 1736   BILITOT 0.4 11/04/2022 1736   GFRNONAA >60 11/04/2022 1736   GFRAA >60 08/27/2019 0727    Latest Reference Range & Units 11/04/22 17:40  Protein Creatinine Ratio 0.00 - 0.15 mg/mgCre 0.16 (H)  (H): Data is abnormally high  Prenatal labs: ABO, Rh:  --/--/O POS (03/09 1810) Antibody: NEG (03/09 1810) Rubella:   RPR:    HBsAg:    HIV:    GBS:       Assessment/Plan: 32Y G2P1001 @ [redacted]w[redacted]d now meeting gestational hypertension criteria, admit for IOL Fetal wellbeing: cat I tracing Gestational hypertension: monitor for signs/symptoms of preeclampsia, antihypertensives as needed IOL Pain control: A2GDM: monitor CBG per protocol  MRowland Lathe3/04/2023, 7:28 PM

## 2022-11-05 ENCOUNTER — Inpatient Hospital Stay (HOSPITAL_COMMUNITY): Payer: 59 | Admitting: Anesthesiology

## 2022-11-05 ENCOUNTER — Encounter (HOSPITAL_COMMUNITY)
Admission: AD | Disposition: A | Discharge status: Home / Self Care | Payer: Self-pay | Source: Home / Self Care | Attending: Obstetrics and Gynecology

## 2022-11-05 ENCOUNTER — Encounter (HOSPITAL_COMMUNITY): Payer: Self-pay | Admitting: Obstetrics and Gynecology

## 2022-11-05 DIAGNOSIS — O99344 Other mental disorders complicating childbirth: Secondary | ICD-10-CM

## 2022-11-05 DIAGNOSIS — O24425 Gestational diabetes mellitus in childbirth, controlled by oral hypoglycemic drugs: Secondary | ICD-10-CM

## 2022-11-05 DIAGNOSIS — Z3A00 Weeks of gestation of pregnancy not specified: Secondary | ICD-10-CM

## 2022-11-05 DIAGNOSIS — O24424 Gestational diabetes mellitus in childbirth, insulin controlled: Secondary | ICD-10-CM

## 2022-11-05 DIAGNOSIS — O134 Gestational [pregnancy-induced] hypertension without significant proteinuria, complicating childbirth: Secondary | ICD-10-CM

## 2022-11-05 DIAGNOSIS — Z3A39 39 weeks gestation of pregnancy: Secondary | ICD-10-CM

## 2022-11-05 LAB — CBC
HCT: 31 % — ABNORMAL LOW (ref 36.0–46.0)
Hemoglobin: 10.2 g/dL — ABNORMAL LOW (ref 12.0–15.0)
MCH: 27.7 pg (ref 26.0–34.0)
MCHC: 32.9 g/dL (ref 30.0–36.0)
MCV: 84.2 fL (ref 80.0–100.0)
Platelets: 235 10*3/uL (ref 150–400)
RBC: 3.68 MIL/uL — ABNORMAL LOW (ref 3.87–5.11)
RDW: 14.6 % (ref 11.5–15.5)
WBC: 10.7 10*3/uL — ABNORMAL HIGH (ref 4.0–10.5)
nRBC: 0 % (ref 0.0–0.2)

## 2022-11-05 LAB — GLUCOSE, CAPILLARY
Glucose-Capillary: 91 mg/dL (ref 70–99)
Glucose-Capillary: 71 mg/dL (ref 70–99)
Glucose-Capillary: 92 mg/dL (ref 70–99)
Glucose-Capillary: 122 mg/dL — ABNORMAL HIGH (ref 70–99)

## 2022-11-05 LAB — RPR: RPR Ser Ql: NONREACTIVE

## 2022-11-05 SURGERY — Surgical Case
Anesthesia: Epidural

## 2022-11-05 MED ORDER — DIPHENHYDRAMINE HCL 50 MG/ML IJ SOLN
INTRAMUSCULAR | Status: DC | PRN
  Administered 2022-11-05: 25 mg via INTRAVENOUS

## 2022-11-05 MED ORDER — SCOPOLAMINE 1 MG/3DAYS TD PT72
1.0000 | MEDICATED_PATCH | Freq: Once | TRANSDERMAL | Status: AC
  Administered 2022-11-05: 1.5 mg via TRANSDERMAL

## 2022-11-05 MED ORDER — MEPERIDINE HCL 25 MG/ML IJ SOLN: 6.2500 mg | INTRAMUSCULAR | Status: DC | PRN

## 2022-11-05 MED ORDER — PHENYLEPHRINE HCL (PRESSORS) 10 MG/ML IV SOLN
INTRAVENOUS | Status: DC | PRN
  Administered 2022-11-05: 160 ug via INTRAVENOUS

## 2022-11-05 MED ORDER — SIMETHICONE 80 MG PO CHEW
80.0000 mg | CHEWABLE_TABLET | Freq: Three times a day (TID) | ORAL | Status: DC
  Administered 2022-11-06 – 2022-11-09 (×10): 80 mg via ORAL
  Filled 2022-11-05 (×10): qty 1

## 2022-11-05 MED ORDER — OXYTOCIN-SODIUM CHLORIDE 30-0.9 UT/500ML-% IV SOLN
INTRAVENOUS | Status: AC
  Filled 2022-11-05: qty 500

## 2022-11-05 MED ORDER — ONDANSETRON HCL 4 MG/2ML IJ SOLN
INTRAMUSCULAR | Status: DC | PRN
  Administered 2022-11-05: 4 mg via INTRAVENOUS

## 2022-11-05 MED ORDER — LIDOCAINE HCL (PF) 1 % IJ SOLN
INTRAMUSCULAR | Status: DC | PRN
  Administered 2022-11-05: 3 mL via EPIDURAL
  Administered 2022-11-05: 5 mL via EPIDURAL
  Administered 2022-11-05: 2 mL via EPIDURAL

## 2022-11-05 MED ORDER — SERTRALINE HCL 50 MG PO TABS
75.0000 mg | ORAL_TABLET | Freq: Every day | ORAL | Status: DC
  Administered 2022-11-06 – 2022-11-09 (×4): 75 mg via ORAL
  Filled 2022-11-05 (×4): qty 1

## 2022-11-05 MED ORDER — NALOXONE HCL 4 MG/10ML IJ SOLN: 1.0000 ug/kg/h | INTRAVENOUS | Status: DC | PRN

## 2022-11-05 MED ORDER — FENTANYL-BUPIVACAINE-NACL 0.5-0.125-0.9 MG/250ML-% EP SOLN
12.0000 mL/h | EPIDURAL | Status: DC | PRN
  Administered 2022-11-05: 12 mL/h via EPIDURAL
  Filled 2022-11-05: qty 250

## 2022-11-05 MED ORDER — PHENYLEPHRINE 80 MCG/ML (10ML) SYRINGE FOR IV PUSH (FOR BLOOD PRESSURE SUPPORT): 80.0000 ug | PREFILLED_SYRINGE | INTRAVENOUS | Status: DC | PRN

## 2022-11-05 MED ORDER — SODIUM CHLORIDE 0.9 % IV SOLN
500.0000 mg | INTRAVENOUS | Status: AC
  Administered 2022-11-05: 500 mg via INTRAVENOUS
  Filled 2022-11-05: qty 5

## 2022-11-05 MED ORDER — DIPHENHYDRAMINE HCL 25 MG PO CAPS: 25.0000 mg | ORAL_CAPSULE | Freq: Four times a day (QID) | ORAL | Status: DC | PRN

## 2022-11-05 MED ORDER — ONDANSETRON HCL 4 MG/2ML IJ SOLN: 4.0000 mg | Freq: Three times a day (TID) | INTRAMUSCULAR | Status: DC | PRN

## 2022-11-05 MED ORDER — MORPHINE SULFATE (PF) 0.5 MG/ML IJ SOLN
INTRAMUSCULAR | Status: DC | PRN
  Administered 2022-11-05: 3 mg via EPIDURAL

## 2022-11-05 MED ORDER — ONDANSETRON HCL 4 MG/2ML IJ SOLN
INTRAMUSCULAR | Status: AC
  Filled 2022-11-05: qty 2

## 2022-11-05 MED ORDER — MENTHOL 3 MG MT LOZG: 1.0000 | LOZENGE | OROMUCOSAL | Status: DC | PRN

## 2022-11-05 MED ORDER — ACETAMINOPHEN 500 MG PO TABS
1000.0000 mg | ORAL_TABLET | Freq: Four times a day (QID) | ORAL | Status: DC
  Administered 2022-11-06 – 2022-11-09 (×13): 1000 mg via ORAL
  Filled 2022-11-05 (×14): qty 2

## 2022-11-05 MED ORDER — OXYTOCIN-SODIUM CHLORIDE 30-0.9 UT/500ML-% IV SOLN
1.0000 m[IU]/min | INTRAVENOUS | Status: DC
  Administered 2022-11-05: 2 m[IU]/min via INTRAVENOUS

## 2022-11-05 MED ORDER — SODIUM CHLORIDE 0.9% FLUSH: 3.0000 mL | INTRAVENOUS | Status: DC | PRN

## 2022-11-05 MED ORDER — ACETAMINOPHEN 500 MG PO TABS
1000.0000 mg | ORAL_TABLET | Freq: Four times a day (QID) | ORAL | Status: DC
  Administered 2022-11-06: 1000 mg via ORAL

## 2022-11-05 MED ORDER — DIPHENHYDRAMINE HCL 50 MG/ML IJ SOLN: 12.5000 mg | INTRAMUSCULAR | Status: DC | PRN

## 2022-11-05 MED ORDER — SODIUM CHLORIDE 0.9 % IV SOLN
INTRAVENOUS | Status: AC
  Filled 2022-11-05: qty 5

## 2022-11-05 MED ORDER — LACTATED RINGERS IV SOLN: INTRAVENOUS | Status: DC

## 2022-11-05 MED ORDER — OXYTOCIN-SODIUM CHLORIDE 30-0.9 UT/500ML-% IV SOLN
INTRAVENOUS | Status: DC | PRN
  Administered 2022-11-05: 30 [IU] via INTRAVENOUS

## 2022-11-05 MED ORDER — TERBUTALINE SULFATE 1 MG/ML IJ SOLN: 0.2500 mg | Freq: Once | INTRAMUSCULAR | Status: DC | PRN

## 2022-11-05 MED ORDER — DIPHENHYDRAMINE HCL 50 MG/ML IJ SOLN
INTRAMUSCULAR | Status: AC
  Filled 2022-11-05: qty 1

## 2022-11-05 MED ORDER — DIPHENHYDRAMINE HCL 25 MG PO CAPS: 25.0000 mg | ORAL_CAPSULE | ORAL | Status: DC | PRN

## 2022-11-05 MED ORDER — IBUPROFEN 600 MG PO TABS
600.0000 mg | ORAL_TABLET | Freq: Four times a day (QID) | ORAL | Status: DC
  Administered 2022-11-06 – 2022-11-09 (×11): 600 mg via ORAL
  Filled 2022-11-05 (×11): qty 1

## 2022-11-05 MED ORDER — EPHEDRINE 5 MG/ML INJ: 10.0000 mg | INTRAVENOUS | Status: DC | PRN

## 2022-11-05 MED ORDER — STERILE WATER FOR IRRIGATION IR SOLN
Status: DC | PRN
  Administered 2022-11-05 (×2): 1

## 2022-11-05 MED ORDER — LACTATED RINGERS IV SOLN: 500.0000 mL | Freq: Once | INTRAVENOUS | Status: DC

## 2022-11-05 MED ORDER — SODIUM CHLORIDE 0.9 % IR SOLN
Status: DC | PRN
  Administered 2022-11-05: 1

## 2022-11-05 MED ORDER — MORPHINE SULFATE (PF) 0.5 MG/ML IJ SOLN
INTRAMUSCULAR | Status: AC
  Filled 2022-11-05: qty 10

## 2022-11-05 MED ORDER — METOCLOPRAMIDE HCL 5 MG/ML IJ SOLN
INTRAMUSCULAR | Status: DC | PRN
  Administered 2022-11-05: 10 mg via INTRAVENOUS

## 2022-11-05 MED ORDER — OXYCODONE HCL 5 MG PO TABS
5.0000 mg | ORAL_TABLET | ORAL | Status: DC | PRN
  Administered 2022-11-07: 10 mg via ORAL
  Filled 2022-11-05: qty 2

## 2022-11-05 MED ORDER — SERTRALINE HCL 50 MG PO TABS
75.0000 mg | ORAL_TABLET | Freq: Every day | ORAL | Status: DC
  Administered 2022-11-05: 75 mg via ORAL
  Filled 2022-11-05: qty 1

## 2022-11-05 MED ORDER — BISACODYL 10 MG RE SUPP: 10.0000 mg | Freq: Every day | RECTAL | Status: DC | PRN

## 2022-11-05 MED ORDER — FENTANYL CITRATE (PF) 100 MCG/2ML IJ SOLN
50.0000 ug | INTRAMUSCULAR | Status: DC | PRN
  Administered 2022-11-05: 50 ug via INTRAVENOUS
  Administered 2022-11-05: 100 ug via INTRAVENOUS
  Filled 2022-11-05 (×2): qty 2

## 2022-11-05 MED ORDER — COCONUT OIL OIL
1.0000 | TOPICAL_OIL | Status: DC | PRN
  Administered 2022-11-06: 1 via TOPICAL

## 2022-11-05 MED ORDER — WITCH HAZEL-GLYCERIN EX PADS: 1.0000 | MEDICATED_PAD | CUTANEOUS | Status: DC | PRN

## 2022-11-05 MED ORDER — METOCLOPRAMIDE HCL 5 MG/ML IJ SOLN
INTRAMUSCULAR | Status: AC
  Filled 2022-11-05: qty 2

## 2022-11-05 MED ORDER — FLEET ENEMA 7-19 GM/118ML RE ENEM: 1.0000 | ENEMA | Freq: Every day | RECTAL | Status: DC | PRN

## 2022-11-05 MED ORDER — SIMETHICONE 80 MG PO CHEW: 80.0000 mg | CHEWABLE_TABLET | ORAL | Status: DC | PRN

## 2022-11-05 MED ORDER — KETOROLAC TROMETHAMINE 30 MG/ML IJ SOLN
30.0000 mg | Freq: Four times a day (QID) | INTRAMUSCULAR | Status: AC
  Administered 2022-11-06 (×3): 30 mg via INTRAVENOUS
  Filled 2022-11-05 (×3): qty 1

## 2022-11-05 MED ORDER — ACETAMINOPHEN 10 MG/ML IV SOLN
INTRAVENOUS | Status: DC | PRN
  Administered 2022-11-05: 1000 mg via INTRAVENOUS

## 2022-11-05 MED ORDER — CEFAZOLIN SODIUM-DEXTROSE 2-4 GM/100ML-% IV SOLN
2.0000 g | INTRAVENOUS | Status: AC
  Administered 2022-11-05: 2 g via INTRAVENOUS
  Filled 2022-11-05: qty 100

## 2022-11-05 MED ORDER — DIBUCAINE (PERIANAL) 1 % EX OINT: 1.0000 | TOPICAL_OINTMENT | CUTANEOUS | Status: DC | PRN

## 2022-11-05 MED ORDER — SODIUM BICARBONATE 8.4 % IV SOLN
INTRAVENOUS | Status: DC | PRN
  Administered 2022-11-05 (×2): 5 mL via EPIDURAL

## 2022-11-05 MED ORDER — FENTANYL CITRATE (PF) 100 MCG/2ML IJ SOLN: 25.0000 ug | INTRAMUSCULAR | Status: DC | PRN

## 2022-11-05 MED ORDER — MISOPROSTOL 25 MCG QUARTER TABLET
25.0000 ug | ORAL_TABLET | ORAL | Status: DC | PRN
  Administered 2022-11-05: 25 ug via VAGINAL
  Filled 2022-11-05: qty 1

## 2022-11-05 MED ORDER — KETOROLAC TROMETHAMINE 30 MG/ML IJ SOLN: 30.0000 mg | Freq: Four times a day (QID) | INTRAMUSCULAR | Status: AC | PRN

## 2022-11-05 MED ORDER — SOD CITRATE-CITRIC ACID 500-334 MG/5ML PO SOLN
30.0000 mL | ORAL | Status: AC
  Administered 2022-11-05: 30 mL via ORAL

## 2022-11-05 MED ORDER — PHENYLEPHRINE 80 MCG/ML (10ML) SYRINGE FOR IV PUSH (FOR BLOOD PRESSURE SUPPORT)
PREFILLED_SYRINGE | INTRAVENOUS | Status: AC
  Filled 2022-11-05: qty 10

## 2022-11-05 MED ORDER — SCOPOLAMINE 1 MG/3DAYS TD PT72
MEDICATED_PATCH | TRANSDERMAL | Status: AC
  Filled 2022-11-05: qty 1

## 2022-11-05 MED ORDER — SODIUM CHLORIDE 0.9 % IV SOLN: INTRAVENOUS | Status: DC | PRN

## 2022-11-05 MED ORDER — MORPHINE SULFATE (PF) 0.5 MG/ML IJ SOLN
INTRAMUSCULAR | Status: DC | PRN
  Administered 2022-11-05: .5 mg via INTRAVENOUS

## 2022-11-05 MED ORDER — TRANEXAMIC ACID-NACL 1000-0.7 MG/100ML-% IV SOLN
INTRAVENOUS | Status: DC | PRN
  Administered 2022-11-05: 1000 mg via INTRAVENOUS

## 2022-11-05 MED ORDER — HYDROMORPHONE HCL 1 MG/ML IJ SOLN: 0.2000 mg | INTRAMUSCULAR | Status: DC | PRN

## 2022-11-05 MED ORDER — TRANEXAMIC ACID-NACL 1000-0.7 MG/100ML-% IV SOLN
INTRAVENOUS | Status: AC
  Filled 2022-11-05: qty 100

## 2022-11-05 MED ORDER — DEXAMETHASONE SODIUM PHOSPHATE 10 MG/ML IJ SOLN
INTRAMUSCULAR | Status: AC
  Filled 2022-11-05: qty 1

## 2022-11-05 MED ORDER — OXYTOCIN-SODIUM CHLORIDE 30-0.9 UT/500ML-% IV SOLN: 2.5000 [IU]/h | INTRAVENOUS | Status: AC

## 2022-11-05 MED ORDER — SENNOSIDES-DOCUSATE SODIUM 8.6-50 MG PO TABS
2.0000 | ORAL_TABLET | ORAL | Status: DC
  Administered 2022-11-06 – 2022-11-07 (×3): 2 via ORAL
  Filled 2022-11-05 (×3): qty 2

## 2022-11-05 MED ORDER — NALOXONE HCL 0.4 MG/ML IJ SOLN: 0.4000 mg | INTRAMUSCULAR | Status: DC | PRN

## 2022-11-05 SURGICAL SUPPLY — 37 items
APL PRP STRL LF DISP 70% ISPRP (MISCELLANEOUS) ×2
APL SKNCLS STERI-STRIP NONHPOA (GAUZE/BANDAGES/DRESSINGS) ×1
BENZOIN TINCTURE PRP APPL 2/3 (GAUZE/BANDAGES/DRESSINGS) ×1 IMPLANT
CHLORAPREP W/TINT 26 (MISCELLANEOUS) ×2 IMPLANT
CLAMP UMBILICAL CORD (MISCELLANEOUS) ×1 IMPLANT
CLIP FILSHIE TUBAL LIGA STRL (Clip) ×1 IMPLANT
CLOTH BEACON ORANGE TIMEOUT ST (SAFETY) ×1 IMPLANT
DRSG OPSITE POSTOP 4X10 (GAUZE/BANDAGES/DRESSINGS) ×1 IMPLANT
DRSG OPSITE POSTOP 4X12 (GAUZE/BANDAGES/DRESSINGS) IMPLANT
ELECT REM PT RETURN 9FT ADLT (ELECTROSURGICAL) ×1
ELECTRODE REM PT RTRN 9FT ADLT (ELECTROSURGICAL) ×1 IMPLANT
EXTRACTOR VACUUM BELL CUP MITY (SUCTIONS) IMPLANT
GAUZE SPONGE 4X4 12PLY STRL LF (GAUZE/BANDAGES/DRESSINGS) IMPLANT
GLOVE BIO SURGEON STRL SZ 6 (GLOVE) ×1 IMPLANT
GLOVE BIOGEL PI IND STRL 6.5 (GLOVE) ×1 IMPLANT
GOWN STRL REUS W/TWL LRG LVL3 (GOWN DISPOSABLE) ×2 IMPLANT
HEMOSTAT SURGICEL 2X14 (HEMOSTASIS) IMPLANT
KIT ABG SYR 3ML LUER SLIP (SYRINGE) ×1 IMPLANT
MAT PREVALON FULL STRYKER (MISCELLANEOUS) IMPLANT
NDL HYPO 25X5/8 SAFETYGLIDE (NEEDLE) ×1 IMPLANT
NEEDLE HYPO 25X5/8 SAFETYGLIDE (NEEDLE) ×1 IMPLANT
NS IRRIG 1000ML POUR BTL (IV SOLUTION) ×1 IMPLANT
PACK C SECTION WH (CUSTOM PROCEDURE TRAY) ×1 IMPLANT
PAD OB MATERNITY 4.3X12.25 (PERSONAL CARE ITEMS) ×1 IMPLANT
RTRCTR C-SECT PINK 25CM LRG (MISCELLANEOUS) ×1 IMPLANT
STRIP CLOSURE SKIN 1/2X4 (GAUZE/BANDAGES/DRESSINGS) ×1 IMPLANT
SUT MNCRL 0 VIOLET CTX 36 (SUTURE) ×2 IMPLANT
SUT MONOCRYL 0 CTX 36 (SUTURE) ×2
SUT VIC AB 0 CT1 36 (SUTURE) ×2 IMPLANT
SUT VIC AB 2-0 CT1 27 (SUTURE) ×1
SUT VIC AB 2-0 CT1 TAPERPNT 27 (SUTURE) IMPLANT
SUT VIC AB 3-0 CT1 27 (SUTURE) ×2
SUT VIC AB 3-0 CT1 TAPERPNT 27 (SUTURE) ×1 IMPLANT
SUT VIC AB 4-0 KS 27 (SUTURE) ×1 IMPLANT
TOWEL OR 17X24 6PK STRL BLUE (TOWEL DISPOSABLE) ×1 IMPLANT
TRAY FOLEY W/BAG SLVR 14FR LF (SET/KITS/TRAYS/PACK) ×1 IMPLANT
WATER STERILE IRR 1000ML POUR (IV SOLUTION) ×1 IMPLANT

## 2022-11-05 NOTE — Op Note (Signed)
CESAREAN SECTION Procedure Note  Patient: Pamela Bell is a 32 y.o. [phone] @ [redacted]w[redacted]d Preoperative Diagnosis:  Intrauterine pregnancy at 39 weeks 2 days Arrest of descent Recurrent variable decelerations with pushing Gestational hypertension Gestational diabetes A2  Postoperative Diagnosis: same, delivered  Procedure:  Primary low transverse cesarean      Surgeon: MRowland Lathe, MD  Assistant: SGerlene Fee DO  An experienced assistant was required given the standard of surgical care given the complexity of the case.  This assistant was needed for exposure, dissection, suctioning, retraction, instrument exchange, assisting with delivery with administration of fundal pressure, and for overall help during the procedure.  Anesthesia: Epidural anesthesia   Findings: Normal appearing uterus, fallopian tubes bilaterally, and ovaries bilaterally.  Viable female infant in left occiput posterior presentation delivered at 2010 with weight 3330g (7 pounds 5.5ounces) Apgars 8 and 9.  Estimated Blood Loss:  7041m        Specimens: Placenta to L&D for disposal         Complications:  None         Disposition: PACU - hemodynamically stable.         Condition: stable    Description of Procedure: The patient was taken to the operating room where epidural anesthesia was rebolused and found to be adequate.  The patient was placed in the dorsal supine position.  Fetal heart tones were noted in 160s. Thromboguards were applied and cycling. A foley catheter was already in place and draining. Ancef 2g and Azithromycin '500mg'$  were given for infection prophylaxis. The patient was subsequently prepped and draped in the normal sterile fashion.    A low transverse skin incision was made with a scalpel and carried down to the level of the fascia with the Bovie.  The fascia was incised in the midline with the scalpel and extended laterally with curved Mayo scissors.  Kocher clamps were applied  to the inferior fascial edge and the fascia was dissected off the rectus muscle sharply using the Mayo scissors.  The Kocher clamps were transferred to the superior fascial edge and the underlying rectus muscle was dissected off with curved Mayo's scissors.  The rectus muscles then were separated in the midline.  The peritoneum was found free of adherent bowel and the peritoneal cavity was entered with Metzenbaum scissors.  The uterus was identified and the alexis retractor was placed intraperitoneal.  A bladder flap was then created sharply with Metzenbaum scissors and separated from the lower uterine segment digitally.   A low transverse hysterotomy was then made with a scalpel.  The infant was found in the vertex presentation was delivered atraumatically and without difficulty with standard maneuvers. After 60 seconds of delayed cord clamping the cord was clamped and cut and the infant was handed off to the pediatricians.  The placenta was delivered with gentle traction on umbilical cord and manual massage of the uterine fundus.  The uterus was cleared of all clot and debris.  The hysterotomy was then closed with 0 vicryl in a running locked fashion,  followed by 0 vicryl in an imbricating fashion.  The hysterotomy was rendered hemostatic with 0 vicryl in figure of 8 stitches. A piece of surgicel was placed over the hysterotomy. Good hemostasis was appreciated. The peritoneum was closed with 2-0 vicryl in a running fashion. The fascia was closed with a 0 Vicryl suture in a continuous running fashion.  The subcutaneous tissue was irrigated and rendered hemostatic with cautery.  The subcutaneous layer was subsequently closed with 3-0 Vicryl in a continuous running fashion.  The skin was closed with 4-0 vicryl  in a running subcuticular fashion.  Sponge, lap and needle counts were correct. Steri strips and a Honeycomb dressing were placed on the incision.   Rowland Lathe 11/05/22  8:57 PM

## 2022-11-05 NOTE — Anesthesia Postprocedure Evaluation (Signed)
Anesthesia Post Note  Patient: Pamela Bell  Procedure(s) Performed: CESAREAN SECTION     Patient location during evaluation: PACU Anesthesia Type: Epidural Level of consciousness: awake and alert Pain management: pain level controlled Vital Signs Assessment: post-procedure vital signs reviewed and stable Respiratory status: spontaneous breathing and respiratory function stable Cardiovascular status: blood pressure returned to baseline and stable Postop Assessment: epidural receding Anesthetic complications: no  No notable events documented.  Last Vitals:  Vitals:   11/05/22 2145 11/05/22 2200  BP: 121/69 97/83  Pulse: 85 85  Resp: (!) 26 (!) 25  Temp: 36.9 C   SpO2: 98% 99%    Last Pain:  Vitals:   11/05/22 2145  TempSrc: Oral  PainSc: 0-No pain   Pain Goal:    LLE Motor Response: Responds to commands (11/05/22 2145) LLE Sensation: Tingling (11/05/22 2145) RLE Motor Response: Responds to commands (11/05/22 2145) RLE Sensation: Tingling (11/05/22 2145) L Sensory Level: T10-Umbilical region (123456 2145) R Sensory Level: T10-Umbilical region (123456 2145) Epidural/Spinal Function Cutaneous sensation: Tingles (11/05/22 2145), Patient able to flex knees: Yes (11/05/22 2145), Patient able to lift hips off bed: Yes (11/05/22 2145), Back pain beyond tenderness at insertion site: No (11/05/22 2145), Progressively worsening motor and/or sensory loss: No (11/05/22 2145), Bowel and/or bladder incontinence post epidural: No (11/05/22 2145)  Tiajuana Amass

## 2022-11-05 NOTE — Progress Notes (Signed)
Patient fully dilated at 0 station 1555, felt rectal pressure, started pushing. Pushed for 2 hours without any descent despite multiple position changes and patient feeling pressure. Initially having intermittent deep variable decelerations that then became recurrent into the 60s. Discussed with patient, no descent despite 2 hours of pushing and recurrent deep variable decelerations, recommend proceeding with cesarean for nonreassuring fetal heart tracing and arrest of descent. Pitocin turned off and OR notified. Since she has stopped pushing, having intermittent but not recurrent variable decelerations.    Reviewed consent form with patient. Reviewed procedure in detail. Reviewed risk of infection, bleeding, blood transfusion, hysterectomy, damage to surrounding organs, fetal laceration. All questions answered. Consent signed.   Ancef 2g and azithromycin ordered. Patient states she has had both keflex and amoxicillin as an adult without any issues despite augmentin allergy as child documented in chart.   Luther Redo, MD 11/05/22 6:11 PM

## 2022-11-05 NOTE — Progress Notes (Signed)
OB Progress Note  S: Pt feeling some rectal pressure   O: Today's Vitals   11/05/22 1332 11/05/22 1402 11/05/22 1432 11/05/22 1502  BP: 113/66 127/68 114/68 133/73  Pulse: (!) 114 (!) 107 (!) 137 (!) 108  Resp:      Temp:      TempSrc:      SpO2:      Weight:      Height:      PainSc:       Body mass index is 36.91 kg/m.  SVE 9.5/100/0, anterior lip  FHR: 140bpm, mod variability, + accels, early decels Toco: ctx q 2-3 mins   A/P: 32Y G2P1001 @ [redacted]w[redacted]d IOL gestational hypertension Fetal wellbeing: cat I tracing Gestational hypertension: monitor for signs/symptoms of preeclampsia, antihypertensives as needed IOL: s/p cytotec x 1, s/p AROM, continue pitocin (currently at 125mmin). Practice pushes attempted to reduce anterior lip without success, will rest with peanut ball for now Pain control: epidural A2GDM: monitor CBG per protocol, have been normal range     M. MaBrien MatesMD 11/05/22 4:21 PM

## 2022-11-05 NOTE — Progress Notes (Signed)
OB Progress Note  S:Comfortable s/p epidural   O: Today's Vitals   11/05/22 0932 11/05/22 1002 11/05/22 1032 11/05/22 1102  BP: (!) 109/58 110/65 111/62 110/68  Pulse: 90 85 91 88  Resp:      Temp:      TempSrc:      SpO2:      Weight:      Height:      PainSc:       Body mass index is 36.91 kg/m.  SVE 4-5/80/-1, AROM copious clear fluid  FHR: 150bpm, mod variability, + accels, no decels Toco: ctx q 2-3 mins    A/P: 32Y G2P1001 @ [redacted]w[redacted]d IOL gestational hypertension Fetal wellbeing: cat I tracing Gestational hypertension: monitor for signs/symptoms of preeclampsia, antihypertensives as needed IOL: s/p cytotec x 1, s/p AROM, continue pitocin (currently at 631mmin) Pain control: epidural A2GDM: monitor CBG per protocol, have been normal range   M. MaBrien MatesMD 11/05/22 11:39 AM

## 2022-11-05 NOTE — Anesthesia Procedure Notes (Signed)
Epidural Patient location during procedure: OB Start time: 11/05/2022 8:10 AM End time: 11/05/2022 8:17 AM  Staffing Anesthesiologist: Suzette Battiest, MD Performed: anesthesiologist   Preanesthetic Checklist Completed: patient identified, IV checked, site marked, risks and benefits discussed, surgical consent, monitors and equipment checked, pre-op evaluation and timeout performed  Epidural Patient position: sitting Prep: DuraPrep and site prepped and draped Patient monitoring: continuous pulse ox and blood pressure Approach: midline Location: L3-L4 Injection technique: LOR air  Needle:  Needle type: Tuohy  Needle gauge: 17 G Needle length: 9 cm and 9 Needle insertion depth: 7 cm Catheter type: closed end flexible Catheter size: 19 Gauge Catheter at skin depth: 12 cm Test dose: negative  Assessment Events: blood not aspirated, no cerebrospinal fluid, injection not painful, no injection resistance, no paresthesia and negative IV test

## 2022-11-05 NOTE — Anesthesia Preprocedure Evaluation (Addendum)
Anesthesia Evaluation  Patient identified by MRN, date of birth, ID band Patient awake    Reviewed: Allergy & Precautions, Patient's Chart, lab work & pertinent test results  History of Anesthesia Complications (+) PONV and history of anesthetic complications  Airway Mallampati: II       Dental no notable dental hx. (+) Teeth Intact   Pulmonary asthma    Pulmonary exam normal        Cardiovascular hypertension, Normal cardiovascular exam Rhythm:Regular Rate:Normal     Neuro/Psych  Headaches PSYCHIATRIC DISORDERS Anxiety Depression       GI/Hepatic Neg liver ROS,GERD  Medicated,,  Endo/Other  diabetes, Well Controlled, Gestational, Oral Hypoglycemic Agents  Obesity  Renal/GU negative Renal ROS  negative genitourinary   Musculoskeletal negative musculoskeletal ROS (+)    Abdominal  (+) + obese  Peds  Hematology  (+) Blood dyscrasia, anemia   Anesthesia Other Findings   Reproductive/Obstetrics (+) Pregnancy                              Anesthesia Physical Anesthesia Plan  ASA: 2  Anesthesia Plan: Epidural   Post-op Pain Management:    Induction:   PONV Risk Score and Plan:   Airway Management Planned: Natural Airway  Additional Equipment:   Intra-op Plan:   Post-operative Plan:   Informed Consent: I have reviewed the patients History and Physical, chart, labs and discussed the procedure including the risks, benefits and alternatives for the proposed anesthesia with the patient or authorized representative who has indicated his/her understanding and acceptance.       Plan Discussed with: Anesthesiologist  Anesthesia Plan Comments:          Anesthesia Quick Evaluation

## 2022-11-05 NOTE — Transfer of Care (Signed)
Immediate Anesthesia Transfer of Care Note  Patient: Pamela Bell  Procedure(s) Performed: CESAREAN SECTION  Patient Location: PACU  Anesthesia Type:Epidural  Level of Consciousness: awake, alert , and oriented  Airway & Oxygen Therapy: Patient Spontanous Breathing  Post-op Assessment: Report given to RN and Post -op Vital signs reviewed and stable  Post vital signs: Reviewed and stable  Last Vitals:  Vitals Value Taken Time  BP 145/116 11/05/22 2110  Temp    Pulse 102 11/05/22 2112  Resp 18 11/05/22 2112  SpO2 100 % 11/05/22 2112  Vitals shown include unvalidated device data.  Last Pain:  Vitals:   11/05/22 1935  TempSrc: Oral  PainSc:          Complications: No notable events documented.

## 2022-11-06 ENCOUNTER — Other Ambulatory Visit: Payer: Self-pay

## 2022-11-06 ENCOUNTER — Encounter (HOSPITAL_COMMUNITY): Payer: Self-pay | Admitting: Obstetrics and Gynecology

## 2022-11-06 LAB — CBC
HCT: 29.3 % — ABNORMAL LOW (ref 36.0–46.0)
Hemoglobin: 9.8 g/dL — ABNORMAL LOW (ref 12.0–15.0)
MCH: 28.3 pg (ref 26.0–34.0)
MCHC: 33.4 g/dL (ref 30.0–36.0)
MCV: 84.7 fL (ref 80.0–100.0)
Platelets: 248 10*3/uL (ref 150–400)
RBC: 3.46 MIL/uL — ABNORMAL LOW (ref 3.87–5.11)
RDW: 14.4 % (ref 11.5–15.5)
WBC: 20.1 10*3/uL — ABNORMAL HIGH (ref 4.0–10.5)
nRBC: 0 % (ref 0.0–0.2)

## 2022-11-06 NOTE — Progress Notes (Signed)
Patient is eating, ambulating, foley in place.  Pain control is good.  Appropriate lochia, no complaints. No flatus.  Vitals:   11/05/22 2236 11/06/22 0331 11/06/22 0547 11/06/22 0954  BP: 122/78 111/71 98/62 109/69  Pulse: 88 84 72 73  Resp: '18 18 18 18  '$ Temp: 98.9 F (37.2 C) 98 F (36.7 C) 98.4 F (36.9 C) 97.9 F (36.6 C)  TempSrc: Oral  Oral Oral  SpO2: 99% 99% 96% 98%  Weight:      Height:        Fundus firm Inc: c/d/I Ext: no calf tenderness  Lab Results  Component Value Date   WBC 20.1 (H) 11/06/2022   HGB 9.8 (L) 11/06/2022   HCT 29.3 (L) 11/06/2022   MCV 84.7 11/06/2022   PLT 248 11/06/2022    --/--/O POS (03/09 1810)  A/P Postop day #1 s/p C/S for arrest of descent/ recurrent variable delerations. GHTN: nl BPs GDMA2: BG 71-121 Acute blood loss anemia: Hb 9.8: PNV with Fe  Routine care.  Expect d/c 3/12.    Allyn Kenner

## 2022-11-06 NOTE — Lactation Note (Signed)
This note was copied from a baby's chart. Lactation Consultation Note  Patient Name: Pamela Bell [id number] Date: 11/06/2022 Age:31 hours Reason for consult: Initial assessment;Term Mom chooses to pump and bottle feed. Mom is giving formula until her milk comes in. Reviewed hand pump instructions w/mom. Mom needs small than 21 flanges. Mom is ordering. Mom has [employer] DEBP at home. Mom declined the use of Hospital DEBP at this time. Mom will use hand pump for now. Bottle feeding instructions of amount according to hours of age given. Answered moms questions asked. Encouraged mom to call as needed.   Maternal Data Has patient been taught Hand Expression?: Yes Does the patient have breastfeeding experience prior to this delivery?: Yes How long did the patient breastfeed?: mom exclusively pumped and bottle fed for 3 months  Feeding Nipple Type: Slow - flow  LATCH Score       Type of Nipple: Inverted  Comfort (Breast/Nipple): Soft / non-tender         Lactation Tools Discussed/Used Tools: Pump;Flanges Flange Size: 21 Breast pump type: Manual Pump Education: Setup, frequency, and cleaning;Milk Storage Reason for Pumping: pump and bottle feeding Pumping frequency: q3hr Pumped volume: 5 mL  Interventions Interventions: Hand pump;LC Services brochure  Discharge    Consult Status Consult Status: Follow-up Date: 11/07/22 Follow-up type: In-patient    Theodoro Kalata 11/06/2022, 4:18 AM

## 2022-11-06 NOTE — Progress Notes (Signed)
MOB was referred for history of depression/anxiety.  * Referral screened out by Clinical Social Worker because none of the following criteria appear to apply:  ~ History of anxiety/depression during this pregnancy, or of post-partum depression following prior delivery.  ~ Diagnosis of anxiety and/or depression within last 3 years  OR  * MOB's symptoms currently being treated with medication and/or therapy.  Per chart review, MOB's OB notes indicate that she has an active prescription for Zoloft '50mg'$ .  Please contact the Clinical Social Worker if needs arise, by [facility] request, or if MOB scores greater than 9/yes to question 10 on Edinburgh Postpartum Depression Screen.  Lorrine Kin, [place] Social Worker [phone]

## 2022-11-07 NOTE — Progress Notes (Signed)
Subjective: Postpartum Day 2: Cesarean Delivery Patient reports incisional pain which is a bit worse than yesterday.  +flatus and tolerating po well,  Voiding without problem.    Objective: Vital signs in last 24 hours: Temp:  [97.7 F (36.5 C)-97.9 F (36.6 C)] 97.7 F (36.5 C) (03/12 0517) Pulse Rate:  [68-77] 68 (03/12 0517) Resp:  [16-18] 16 (03/12 0517) BP: (100-121)/(64-77) 100/64 (03/12 0517) SpO2:  [98 %-99 %] 99 % (03/11 2143)  Physical Exam:  General: alert and cooperative Lochia: appropriate Uterine Fundus: firm Incision: Dressing intact and no erythema or drainage   Recent Labs    11/05/22 0529 11/06/22 0432  HGB 10.2* 9.8*  HCT 31.0* 29.3*    Assessment/Plan: Status post Cesarean section. Doing well postoperatively.   Increased incisional pain likely just from epidural duramorph wearing off and less frequent meds overnight.  Encouraged prn oxycodone with food.   Ambulating.   Continue current care with d/c planned for tomorrow since delivered later pm of 11/05/22.  Logan Bores, MD 11/07/2022, 9:48 AM

## 2022-11-07 NOTE — Lactation Note (Signed)
This note was copied from a baby's chart. Lactation Consultation Note  Patient Name: Pamela Bell [id number] Date: 11/07/2022 Age:31 hours Reason for consult: Term;Follow-up assessment Birth Parent with hx : GHTN, GDM, see Birth Parent's -MR.  Per Birth Parent her feeding preference is " Pumping only " and formula feeding infant. Birth Parent used hand pump 3 times today, expressed 5 mls, then 10 mls and third time lesser amount. Birth Parent knows that her EBM is safe for 4 hours at room temperature whereas formula must be used within 1 hour. LC observed that DEBP was set up in room, Birth Parent is open to using it, LC discussed importance of pumping both breast every 3 hours for 15 minutes on initial setting to help stimulate and establish Birth Parent' milk supply. Birth Parent is now open to using DEBP and will use DEBP going forward. Infant is current consuming 25-35 mls of formula per feeding, Birth Parent will offer any EBM first and then formula for each feeding, Birth Parent will continue to feed infant according to hunger cues, on demand, 8+ times within 24 hours.   Maternal Data    Feeding Mother's Current Feeding Choice: Breast Milk and Formula Nipple Type: Slow - flow  LATCH Score                    Lactation Tools Discussed/Used    Interventions Interventions: Education;Pace feeding;DEBP;Expressed milk  Discharge    Consult Status Consult Status: Follow-up Date: 11/08/22 Follow-up type: In-patient    Eulis Canner 11/07/2022, 5:49 PM

## 2022-11-08 MED ORDER — IBUPROFEN 600 MG PO TABS: 600.0000 mg | ORAL_TABLET | Freq: Four times a day (QID) | ORAL | 0 refills | Status: DC

## 2022-11-08 MED ORDER — OXYCODONE HCL 5 MG PO TABS: 5.0000 mg | ORAL_TABLET | ORAL | 0 refills | Status: DC | PRN

## 2022-11-08 NOTE — Discharge Instructions (Signed)
As per discharge pamphlet °

## 2022-11-08 NOTE — Progress Notes (Signed)
POD #3 Doing well, still some pain but tolerable Afeb, VSS, BP nl so far Abd- soft, fundus firm at U+1, incision intact D/c home today

## 2022-11-08 NOTE — Discharge Summary (Signed)
Postpartum Discharge Summary      Patient Name: Pamela Bell DOB: 1991/01/08 MRN: [id number]  Date of admission: 11/04/2022 Delivery date:11/05/2022  Delivering provider: Irene Pap E  Date of discharge: 11/09/2022   Admitting diagnosis: Gestational hypertension [O13.9] Intrauterine pregnancy: [redacted]w[redacted]d    Secondary diagnosis:  Principal Problem:   Gestational hypertension     Discharge diagnosis: Term Pregnancy Delivered, Gestational Hypertension, and GDM A2                                               Hospital course: Induction of Labor With Cesarean Section   32y.o. yo G2P2002 at 349w2das admitted to the hospital 11[facility]04[facility]2022 [facility] significant for protracted labor. The patient went for cesarean section due to Arrest of Descent. Delivery details are as follows: Membrane Rupture Time/Date: 11:15 AM ,11/05/2022   Delivery Method:C-Section, Low Transverse  Details of operation can be found in separate operative Note.  Patient had a postpartum course complicated by nothing, BP remained normal. She is ambulating, tolerating a regular diet, passing flatus, and urinating well.  Patient is discharged home in stable condition on 11/08/22.      Newborn Data: Birth date:11/05/2022  Birth time:8:10 PM  Gender:Female  Living status:Living  Apgars:8 ,9  Weight:3330 g                                Physical exam  Vitals:   11/07/22 1429 11/07/22 1741 11/07/22 2142 11/08/22 0538  BP: 114/70 116/74 118/73 127/74  Pulse: 76  85 82  Resp: '18  19 18  '$ Temp:   98.1 F (36.7 C) 98.4 F (36.9 C)  TempSrc:   Axillary Oral  SpO2:   99% 97%  Weight:      Height:       General: alert Lochia: appropriate Uterine Fundus: firm Incision: Healing well with no significant drainage  Labs: Lab Results  Component Value Date   WBC 20.1 (H) 11/06/2022   HGB 9.8 (L) 11/06/2022   HCT 29.3 (L) 11/06/2022   MCV 84.7 11/06/2022   PLT 248  11/06/2022      Latest Ref Rng & Units 11/04/2022    5:36 PM  CMP  Glucose 70 - 99 mg/dL 92   BUN 6 - 20 mg/dL 6   Creatinine 0.44 - 1.00 mg/dL 0.73   Sodium 135 - 145 mmol/L 136   Potassium 3.5 - 5.1 mmol/L 4.1   Chloride 98 - 111 mmol/L 103   CO2 22 - 32 mmol/L 17   Calcium 8.9 - 10.3 mg/dL 9.6   Total Protein 6.5 - 8.1 g/dL 6.3   Total Bilirubin 0.3 - 1.2 mg/dL 0.4   Alkaline Phos 38 - 126 U/L 223   AST 15 - 41 U/L 20   ALT 0 - 44 U/L 15    Edinburgh Score:    11/05/2022   11:49 PM  Edinburgh Postnatal Depression Scale Screening Tool  I have been able to laugh and see the funny side of things. 0  I have looked forward with enjoyment to things. 1  I have blamed myself unnecessarily when things went wrong. 1  I have been anxious or worried for no good reason. 2  I have felt scared or panicky for no good reason. 1  Things have been getting on top of me. 1  I have been so unhappy that I have had difficulty sleeping. 0  I have felt sad or miserable. 1  I have been so unhappy that I have been crying. 0  The thought of harming myself has occurred to me. 0  Edinburgh Postnatal Depression Scale Total 7      After visit meds:  Allergies as of 11/08/2022       Reactions   Bee Venom Anaphylaxis   Cranberry Anaphylaxis   Augmentin [amoxicillin-pot Clavulanate]    As a child    Latex Rash   Red Dye Hives        Medication List     STOP taking these medications    Accu-Chek Guide Me w/Device Kit   Accu-Chek Guide test strip Generic drug: glucose blood   Accu-Chek Softclix Lancets lancets   benzonatate 200 MG capsule Commonly known as: TESSALON   metFORMIN 500 MG tablet Commonly known as: GLUCOPHAGE   metoCLOPramide 10 MG tablet Commonly known as: REGLAN   pantoprazole 40 MG tablet Commonly known as: PROTONIX       TAKE these medications    acetaminophen 500 MG tablet Commonly known as: TYLENOL Take 1,000 mg by mouth every 6 (six) hours as needed  for mild pain.   albuterol 108 (90 Base) MCG/ACT inhaler Commonly known as: VENTOLIN HFA Inhale 1-2 puffs into the lungs every 6 (six) hours as needed for wheezing or shortness of breath.   cyclobenzaprine 10 MG tablet Commonly known as: FLEXERIL Take 1 tablet (10 mg total) by mouth 3 (three) times daily as needed (rib pain).   ibuprofen 600 MG tablet Commonly known as: ADVIL Take 1 tablet (600 mg total) by mouth every 6 (six) hours.   IRON PO Take by mouth.   loratadine 10 MG tablet Commonly known as: CLARITIN Take 10 mg by mouth daily.   oxyCODONE 5 MG immediate release tablet Commonly known as: Oxy IR/ROXICODONE Take 1 tablet (5 mg total) by mouth every 4 (four) hours as needed for severe pain.   prenatal multivitamin Tabs tablet Take 1 tablet by mouth daily at 12 noon.   sertraline 50 MG tablet Commonly known as: ZOLOFT Take 75 mg by mouth daily.         Discharge home in stable condition Infant Feeding: Breast Infant Disposition:home with mother Discharge instruction: per After Visit Summary and Postpartum booklet. Activity: Advance as tolerated. Pelvic rest for 6 weeks.  Diet: routine diet Postpartum Appointment: 5 days for BP check Follow up Visit:  Arnold, [place], DO. Schedule an appointment as soon as possible for a visit.   Specialty: Obstetrics and Gynecology Why: for BP check Contact information: [address] [place] [address] [phone]                     11/08/2022 Clarene Duke, MD

## 2022-11-08 NOTE — Progress Notes (Signed)
Baby not going home until tomorrow so canceling discharge until tomorrow

## 2022-11-09 NOTE — Lactation Note (Signed)
This note was copied from a baby's chart. Lactation Consultation Note  Patient Name: Pamela Bell [id number] Date: 11/09/2022 Age:31 days Reason for consult: Term;Exclusive pumping and bottle feeding;Follow-up assessment;Infant weight loss (3 % weight loss, per mom milk is coming in and pumped off 6 ml , mom seemed excited. LC reviewed BF D/C teaching and the [facility] resources . per mom has a DEBP at home - Medela)   Maternal Data    Feeding Mother's Current Feeding Choice: Breast Milk and Formula Nipple Type: Slow - flow   Lactation Tools Discussed/Used Breast pump type: Double-Electric Breast Pump Pump Education: Setup, frequency, and cleaning;Milk Storage  Interventions    Discharge Discharge Education: Engorgement and breast care;Warning signs for feeding baby Pump: DEBP;Personal;Manual  Consult Status Consult Status: Complete Date: 11/09/22    Myer Haff 11/09/2022, 11:47 AM

## 2022-11-12 ENCOUNTER — Inpatient Hospital Stay (HOSPITAL_COMMUNITY): Admission: RE | Admit: 2022-11-12 | Payer: 59 | Source: Home / Self Care | Admitting: Obstetrics

## 2022-11-12 ENCOUNTER — Encounter (HOSPITAL_COMMUNITY): Payer: Self-pay | Admitting: Obstetrics and Gynecology

## 2022-11-12 ENCOUNTER — Inpatient Hospital Stay (HOSPITAL_COMMUNITY)
Admission: AD | Admit: 2022-11-12 | Discharge: 2022-11-12 | Disposition: A | Discharge status: Home / Self Care | Payer: 59 | Attending: Obstetrics and Gynecology | Admitting: Obstetrics and Gynecology

## 2022-11-12 ENCOUNTER — Inpatient Hospital Stay (HOSPITAL_COMMUNITY): Payer: 59

## 2022-11-12 DIAGNOSIS — R001 Bradycardia, unspecified: Secondary | ICD-10-CM | POA: Insufficient documentation

## 2022-11-12 DIAGNOSIS — O165 Unspecified maternal hypertension, complicating the puerperium: Secondary | ICD-10-CM

## 2022-11-12 DIAGNOSIS — I517 Cardiomegaly: Secondary | ICD-10-CM | POA: Insufficient documentation

## 2022-11-12 DIAGNOSIS — R519 Headache, unspecified: Secondary | ICD-10-CM | POA: Diagnosis not present

## 2022-11-12 DIAGNOSIS — O135 Gestational [pregnancy-induced] hypertension without significant proteinuria, complicating the puerperium: Secondary | ICD-10-CM | POA: Insufficient documentation

## 2022-11-12 DIAGNOSIS — Z98891 History of uterine scar from previous surgery: Secondary | ICD-10-CM

## 2022-11-12 LAB — CBC
HCT: 26.5 % — ABNORMAL LOW (ref 36.0–46.0)
Hemoglobin: 8.4 g/dL — ABNORMAL LOW (ref 12.0–15.0)
MCH: 27.4 pg (ref 26.0–34.0)
MCHC: 31.7 g/dL (ref 30.0–36.0)
MCV: 86.3 fL (ref 80.0–100.0)
Platelets: 326 10*3/uL (ref 150–400)
RBC: 3.07 MIL/uL — ABNORMAL LOW (ref 3.87–5.11)
RDW: 14.1 % (ref 11.5–15.5)
WBC: 13.1 10*3/uL — ABNORMAL HIGH (ref 4.0–10.5)
nRBC: 0.8 % — ABNORMAL HIGH (ref 0.0–0.2)

## 2022-11-12 LAB — COMPREHENSIVE METABOLIC PANEL
ALT: 24 U/L (ref 0–44)
AST: 22 U/L (ref 15–41)
Albumin: 2.3 g/dL — ABNORMAL LOW (ref 3.5–5.0)
Alkaline Phosphatase: 144 U/L — ABNORMAL HIGH (ref 38–126)
Anion gap: 9 (ref 5–15)
BUN: 12 mg/dL (ref 6–20)
CO2: 22 mmol/L (ref 22–32)
Calcium: 8.4 mg/dL — ABNORMAL LOW (ref 8.9–10.3)
Chloride: 107 mmol/L (ref 98–111)
Creatinine, Ser: 0.81 mg/dL (ref 0.44–1.00)
GFR, Estimated: >60 mL/min (ref >60)
Glucose, Bld: 97 mg/dL (ref 70–99)
Potassium: 3.3 mmol/L — ABNORMAL LOW (ref 3.5–5.1)
Sodium: 138 mmol/L (ref 135–145)
Total Bilirubin: 0.3 mg/dL (ref 0.3–1.2)
Total Protein: 5.6 g/dL — ABNORMAL LOW (ref 6.5–8.1)

## 2022-11-12 MED ORDER — NIFEDIPINE ER OSMOTIC RELEASE 30 MG PO TB24
30.0000 mg | ORAL_TABLET | Freq: Once | ORAL | Status: AC
  Administered 2022-11-12: 30 mg via ORAL
  Filled 2022-11-12: qty 1

## 2022-11-12 MED ORDER — FUROSEMIDE 20 MG PO TABS
20.0000 mg | ORAL_TABLET | Freq: Once | ORAL | Status: AC
  Administered 2022-11-12: 20 mg via ORAL
  Filled 2022-11-12: qty 1

## 2022-11-12 MED ORDER — ACETAMINOPHEN-CAFFEINE 500-65 MG PO TABS
2.0000 | ORAL_TABLET | Freq: Once | ORAL | Status: AC
  Administered 2022-11-12: 2 via ORAL
  Filled 2022-11-12: qty 2

## 2022-11-12 MED ORDER — NIFEDIPINE ER OSMOTIC RELEASE 30 MG PO TB24: 30.0000 mg | ORAL_TABLET | Freq: Every day | ORAL | 1 refills | Status: DC

## 2022-11-12 MED ORDER — FUROSEMIDE 20 MG PO TABS: 20.0000 mg | ORAL_TABLET | Freq: Every day | ORAL | 0 refills | Status: DC

## 2022-11-12 NOTE — MAU Provider Note (Signed)
History     CSN: [id number]  Arrival date and time: 11/12/22 0003   Event Date/Time   First Provider Initiated Contact with Patient 11/12/22 0038      Chief Complaint  Patient presents with   Hypertension   Foot Swelling   Pamela Bell is a 32 y.o. G2P2002 at 7 Days postpartum from a primary C/S who receives care at [facility].  She presents today for elevated blood pressure.  Patient reports home bps were 150-160s/90s.  She reports she was told to come in for evaluation, but while en route a HA developed.  She states the HA is frontal and on the top of her head.  She describes the pain as a throbbing, aching and finds that it also radiates to her right eye.  She rates the pain a 6/10.   She also reports some floaters in her eyes that are the same as when she was pregnant.  She denies recent eye exam.  Patient reports no issues with her incision.  She does report some right side rib pain that is intermittent and lasts 1-54min when it occurs.  No issues with urination, constipation, or diarrhea.    OB History     Gravida  2   Para  2   Term  2   Preterm      AB      Living  2      SAB      IAB      Ectopic      Multiple  0   Live Births  2           Past Medical History:  Diagnosis Date   Anemia    Anxiety    Bilateral ovarian cysts    Depression    takes zoloft   Gestational diabetes    Headache    PONV (postoperative nausea and vomiting)    UTI (urinary tract infection)     Past Surgical History:  Procedure Laterality Date   ADENOIDECTOMY AND MYRINGOTOMY WITH TUBE PLACEMENT     CESAREAN SECTION N/A 11/05/2022   Procedure: CESAREAN SECTION;  Surgeon: Rowland Lathe, MD;  Location: [facility] LD ORS;  Service: Obstetrics;  Laterality: N/A;   EYE SURGERY      Family History  Problem Relation Age of Onset   Depression Mother    Anxiety disorder Mother    Hypertension Mother    Asthma Mother    Hypothyroidism Mother    Anxiety disorder  Father    Asthma Father    Cancer Paternal Grandfather    ADD / ADHD Neg Hx    Alcohol abuse Neg Hx    Arthritis Neg Hx    Birth defects Neg Hx    COPD Neg Hx    Diabetes Neg Hx    Early death Neg Hx    Drug abuse Neg Hx    Hearing loss Neg Hx    Heart disease Neg Hx    Hyperlipidemia Neg Hx    Intellectual disability Neg Hx    Kidney disease Neg Hx    Learning disabilities Neg Hx    Miscarriages / Stillbirths Neg Hx    Obesity Neg Hx    Stroke Neg Hx    Vision loss Neg Hx    Varicose Veins Neg Hx     Social History   Tobacco Use   Smoking status: Never   Smokeless tobacco: Never   Tobacco comments:    Quit  with preg, social- not regular/dly user  Vaping Use   Vaping Use: Former  Substance Use Topics   Alcohol use: Not Currently   Drug use: Never    Allergies:  Allergies  Allergen Reactions   Bee Venom Anaphylaxis   Cranberry Anaphylaxis   Augmentin [Amoxicillin-Pot Clavulanate]     As a child    Latex Rash   Red Dye Hives    Medications Prior to Admission  Medication Sig Dispense Refill Last Dose   Ferrous Sulfate (IRON PO) Take by mouth.   11/11/2022   fluticasone (FLONASE) 50 MCG/ACT nasal spray Place 1 spray into both nostrils daily.   11/11/2022   loratadine (CLARITIN) 10 MG tablet Take 10 mg by mouth daily.   11/11/2022   Prenatal Vit-Fe Fumarate-FA (PRENATAL MULTIVITAMIN) TABS tablet Take 1 tablet by mouth daily at 12 noon.   11/11/2022   sertraline (ZOLOFT) 50 MG tablet Take 75 mg by mouth daily.   11/11/2022   acetaminophen (TYLENOL) 500 MG tablet Take 1,000 mg by mouth every 6 (six) hours as needed for mild pain.      albuterol (VENTOLIN HFA) 108 (90 Base) MCG/ACT inhaler Inhale 1-2 puffs into the lungs every 6 (six) hours as needed for wheezing or shortness of breath. 18 g 0    cyclobenzaprine (FLEXERIL) 10 MG tablet Take 1 tablet (10 mg total) by mouth 3 (three) times daily as needed (rib pain). 30 tablet 0    ibuprofen (ADVIL) 600 MG tablet Take 1  tablet (600 mg total) by mouth every 6 (six) hours. 30 tablet 0    oxyCODONE (OXY IR/ROXICODONE) 5 MG immediate release tablet Take 1 tablet (5 mg total) by mouth every 4 (four) hours as needed for severe pain. 10 tablet 0     Review of Systems  Eyes:  Positive for visual disturbance ("Floaters").  Neurological:  Positive for headaches.   Physical Exam   Blood pressure (!) 140/81, pulse (!) 58, temperature 98.3 F (36.8 C), temperature source Oral, resp. rate 18, height 5\' 2"  (1.575 m), weight 90.5 kg, SpO2 98 %, currently breastfeeding.  Vitals:   11/12/22 0028 11/12/22 0115 11/12/22 0130 11/12/22 0145  BP: (!) 140/81 (!) 149/72 (!) 146/82 (!) 146/85   11/12/22 0200 11/12/22 0215 11/12/22 0230 11/12/22 0245  BP: 138/71 (!) 144/73 135/64 131/67      Physical Exam HENT:     Head: Normocephalic and atraumatic.  Eyes:     Conjunctiva/sclera: Conjunctivae normal.  Cardiovascular:     Rate and Rhythm: Bradycardia present.     Pulses:          Radial pulses are 2+ on the right side.  Pulmonary:     Effort: Pulmonary effort is normal. No respiratory distress.  Abdominal:     Palpations: Abdomen is soft.     Tenderness: There is no abdominal tenderness.     Comments: Steri strips removed from incision.  Incision CDI and well approximated. No s/s of infection. Incision cleaned and steri strips applied on left side x 2.   Musculoskeletal:     Cervical back: Normal range of motion.     Right lower leg: 2+ Pitting Edema present.     Left lower leg: 2+ Pitting Edema present.     Right ankle: Swelling present.     Left ankle: Swelling present.  Skin:    General: Skin is warm and dry.  Neurological:     Mental Status: She is alert and oriented to person,  place, and time.  Psychiatric:        Mood and Affect: Mood normal.        Behavior: Behavior normal.     MAU Course  Procedures Results for orders placed or performed during the hospital encounter of 11/12/22 (from the past  24 hour(s))  CBC     Status: Abnormal   Collection Time: 11/12/22  1:48 AM  Result Value Ref Range   WBC 13.1 (H) 4.0 - 10.5 K/uL   RBC 3.07 (L) 3.87 - 5.11 MIL/uL   Hemoglobin 8.4 (L) 12.0 - 15.0 g/dL   HCT 26.5 (L) 36.0 - 46.0 %   MCV 86.3 80.0 - 100.0 fL   MCH 27.4 26.0 - 34.0 pg   MCHC 31.7 30.0 - 36.0 g/dL   RDW 14.1 11.5 - 15.5 %   Platelets 326 150 - 400 K/uL   nRBC 0.8 (H) 0.0 - 0.2 %  Comprehensive metabolic panel     Status: Abnormal   Collection Time: 11/12/22  1:48 AM  Result Value Ref Range   Sodium 138 135 - 145 mmol/L   Potassium 3.3 (L) 3.5 - 5.1 mmol/L   Chloride 107 98 - 111 mmol/L   CO2 22 22 - 32 mmol/L   Glucose, Bld 97 70 - 99 mg/dL   BUN 12 6 - 20 mg/dL   Creatinine, Ser 0.81 0.44 - 1.00 mg/dL   Calcium 8.4 (L) 8.9 - 10.3 mg/dL   Total Protein 5.6 (L) 6.5 - 8.1 g/dL   Albumin 2.3 (L) 3.5 - 5.0 g/dL   AST 22 15 - 41 U/L   ALT 24 0 - 44 U/L   Alkaline Phosphatase 144 (H) 38 - 126 U/L   Total Bilirubin 0.3 0.3 - 1.2 mg/dL   GFR, Estimated >60 >60 mL/min   Anion gap 9 5 - 15    MDM Physical Exam Labs: CBC, CMP, PC Ratio Measure BPQ15 min EFM Pain Management Consult EKG Assessment and Plan  32 year old Postpartum State GHTN Headache  -POC Reviewed. -Exam performed. -Discussed medication including lasix, procardia, and HA treatment. Patient agreeable. -Will give excedrin migraine for HA. -Labs ordered. -Monitor and await results.   Maryann Conners 11/12/2022, 12:43 AM   Reassessment (2:48 AM) -BP normalizing, but bradycardia continues.  -Provider to bedside, patient reports improvement in HA and now 4/10. -Patient reports this is manageable and declines further pain medication.  -Reviewed results and informed of discharge plan to proceed with prescriptions for lasix x 4 days and procardia 30mg  daily until discontinued by primary ob.  -Patient reports appt is scheduled for Monday March 18th and questions need for MD visit. Informed that  likely not necessary, but will review with attending tonight.  -Continued bradycardia noted despite improved blood pressures. Manual radial pulse at 55bpm by provider assessment.  -Dr. Sheryn Bison consulted and informed of patient status, evaluation, interventions, and results. Advised: *Perform EKG and if normal sinus bradycardia, plan to discharge home.  *Patient to have short follow up and okay with blood pressure evaluation tomorrow.  -Provider back to bedside to inform patient of MD recommendation. Patient questions regarding causes addressed. -Will await EKG.   Reassessment (3:16 AM) -Dr. Gomez Cleverly returns call and available to read EKG.  Reports: *Normal intervals, normal ST changes and normal sinus bradycardia. States low voltage, but doesn't meet criteria and nothing concerning about that. Further states that even though it is sinus bradycardia considering blood pressure and no degree of conduction  disease on EKG would give normal rating. I appreciate Dr. Grayce Sessions consult. -Patient updated on cardiology reading -Reviewed discharge and follow up. -Patient without questions. -Return precautions reviewed. -Discharged to home in stable condition.  Maryann Conners MSN, CNM Advanced Practice Provider, [facility]

## 2022-11-12 NOTE — MAU Note (Signed)
.  Pamela Bell is a 32 y.o. at [facility] C/S 7 days here in MAU reporting: leg swelling since Thursday when she was discharged PP, newly onset HA, and elevated BP. Pt took her BP 150-160/92 around 2300 and called her OB and was told to come be evaluated. Pt states she has not had any previous BP concerns during pregnancy. Pt reports some floaters occasional. Pt reports left sided rib pain.   Onset of complaint: Thursday  Pain score: 6/10 HA, 2/10 Rib Vitals:   11/12/22 0028  BP: (!) 140/81  Pulse: (!) 58  Resp: 18  Temp: 98.3 F (36.8 C)  SpO2: 98%      Lab orders placed from triage:

## 2022-11-16 ENCOUNTER — Telehealth (HOSPITAL_COMMUNITY): Payer: Self-pay | Admitting: *Deleted

## 2022-11-16 NOTE — Telephone Encounter (Signed)
Left phone voicemail message.  Odis Hollingshead, RN 11-16-2022 at 11:23am

## 2023-06-25 DIAGNOSIS — R102 Pelvic and perineal pain: Secondary | ICD-10-CM | POA: Diagnosis not present

## 2023-06-26 DIAGNOSIS — R102 Pelvic and perineal pain: Secondary | ICD-10-CM | POA: Diagnosis not present

## 2023-06-26 DIAGNOSIS — N8003 Adenomyosis of the uterus: Secondary | ICD-10-CM | POA: Diagnosis not present

## 2024-02-20 ENCOUNTER — Other Ambulatory Visit: Payer: Self-pay | Admitting: Nurse Practitioner

## 2024-02-20 DIAGNOSIS — E041 Nontoxic single thyroid nodule: Secondary | ICD-10-CM

## 2024-02-27 ENCOUNTER — Ambulatory Visit
Admission: RE | Admit: 2024-02-27 | Discharge: 2024-02-27 | Disposition: A | Discharge status: Home / Self Care | Source: Ambulatory Visit | Attending: Nurse Practitioner

## 2024-02-27 ENCOUNTER — Other Ambulatory Visit (HOSPITAL_COMMUNITY)
Admission: RE | Admit: 2024-02-27 | Discharge: 2024-02-27 | Disposition: A | Discharge status: Home / Self Care | Source: Ambulatory Visit | Attending: Nurse Practitioner | Admitting: Nurse Practitioner

## 2024-02-27 DIAGNOSIS — E041 Nontoxic single thyroid nodule: Secondary | ICD-10-CM | POA: Diagnosis present

## 2024-02-27 NOTE — Procedures (Signed)
 PROCEDURE SUMMARY:  Using direct ultrasound guidance, 5 passes were made using 25 g needles into the nodule within the right upper lobe of the thyroid.   Ultrasound was used to confirm needle placements on all occasions.   EBL = trace  Specimens were sent to Pathology for analysis.  See procedure note under Imaging tab in Epic for full procedure details.  Pamela JAYSON Jurist PA-C 02/27/2024 8:30 AM

## 2024-03-04 LAB — CYTOLOGY - NON PAP

## 2024-03-17 ENCOUNTER — Encounter (HOSPITAL_COMMUNITY): Payer: Self-pay

## 2024-04-02 DIAGNOSIS — E559 Vitamin D deficiency, unspecified: Secondary | ICD-10-CM | POA: Diagnosis not present

## 2024-04-02 DIAGNOSIS — E063 Autoimmune thyroiditis: Secondary | ICD-10-CM | POA: Diagnosis not present

## 2024-04-02 DIAGNOSIS — I10 Essential (primary) hypertension: Secondary | ICD-10-CM | POA: Diagnosis not present

## 2024-04-02 DIAGNOSIS — E039 Hypothyroidism, unspecified: Secondary | ICD-10-CM | POA: Diagnosis not present

## 2024-04-02 DIAGNOSIS — E66812 Obesity, class 2: Secondary | ICD-10-CM | POA: Diagnosis not present

## 2024-04-02 DIAGNOSIS — K30 Functional dyspepsia: Secondary | ICD-10-CM | POA: Diagnosis not present

## 2024-04-02 DIAGNOSIS — R7303 Prediabetes: Secondary | ICD-10-CM | POA: Diagnosis not present

## 2024-04-02 DIAGNOSIS — E782 Mixed hyperlipidemia: Secondary | ICD-10-CM | POA: Diagnosis not present

## 2024-04-02 DIAGNOSIS — Z6835 Body mass index (BMI) 35.0-35.9, adult: Secondary | ICD-10-CM | POA: Diagnosis not present

## 2024-04-04 ENCOUNTER — Other Ambulatory Visit (HOSPITAL_BASED_OUTPATIENT_CLINIC_OR_DEPARTMENT_OTHER): Payer: Self-pay

## 2024-04-04 ENCOUNTER — Other Ambulatory Visit (HOSPITAL_COMMUNITY): Payer: Self-pay

## 2024-04-04 MED ORDER — LEVOTHYROXINE SODIUM 75 MCG PO TABS
75.0000 ug | ORAL_TABLET | Freq: Every morning | ORAL | 3 refills | Status: DC
  Filled 2024-04-04: qty 30, 30d supply, fill #0

## 2024-04-04 MED ORDER — LEVOTHYROXINE SODIUM 75 MCG PO TABS
75.0000 ug | ORAL_TABLET | Freq: Every morning | ORAL | 3 refills | Status: DC
  Filled 2024-04-04 (×2): qty 30, 30d supply, fill #0

## 2024-04-16 ENCOUNTER — Other Ambulatory Visit (HOSPITAL_BASED_OUTPATIENT_CLINIC_OR_DEPARTMENT_OTHER): Payer: Self-pay

## 2024-04-16 ENCOUNTER — Ambulatory Visit (INDEPENDENT_AMBULATORY_CARE_PROVIDER_SITE_OTHER): Admitting: Family Medicine

## 2024-04-16 ENCOUNTER — Encounter: Payer: Self-pay | Admitting: Family Medicine

## 2024-04-16 VITALS — BP 102/74 | HR 78 | Temp 98.0°F | Wt 198.0 lb

## 2024-04-16 DIAGNOSIS — M7711 Lateral epicondylitis, right elbow: Secondary | ICD-10-CM

## 2024-04-16 DIAGNOSIS — F39 Unspecified mood [affective] disorder: Secondary | ICD-10-CM | POA: Insufficient documentation

## 2024-04-16 DIAGNOSIS — E063 Autoimmune thyroiditis: Secondary | ICD-10-CM | POA: Insufficient documentation

## 2024-04-16 MED ORDER — MELOXICAM 15 MG PO TABS
15.0000 mg | ORAL_TABLET | Freq: Every day | ORAL | 0 refills | Status: DC
  Filled 2024-04-16: qty 30, 30d supply, fill #0

## 2024-04-16 NOTE — Progress Notes (Signed)
   Subjective:    Patient ID: Pamela Bell, female    DOB: 10-Apr-1991, 33 y.o.   MRN: [id number]  HPI Arm pain- pt had blood draw ~2 weeks ago.  It was painful at the time.  Pain improved.  1 week ago developed pain over R elbow.  Pain is burning and aching.  Will at times shoot down arm.  Having a hard time grasping.  Has pain w/ pronation.  Has a burning pain w/ leaning on elbow.     Review of Systems For ROS see HPI     Objective:   Physical Exam Vitals reviewed.  Constitutional:      Appearance: Normal appearance.  HENT:     Head: Normocephalic and atraumatic.  Cardiovascular:     Pulses: Normal pulses.  Musculoskeletal:        General: Tenderness (TTP over R lateral epicondyle) present.     Comments: Pain w/ pronation/suppination of R forearm  Skin:    General: Skin is warm and dry.  Neurological:     General: No focal deficit present.     Mental Status: She is alert and oriented to person, place, and time.  Psychiatric:        Mood and Affect: Mood normal.        Behavior: Behavior normal.        Thought Content: Thought content normal.           Assessment & Plan:  R lateral epicondylitis- new.  Reviewed dx and tx plan w/ pt.  Limit repetitive motion, heavy lifting.  Ice, daily Meloxicam .  To get counterforce strap.  Reviewed supportive care and red flags that should prompt return.  Pt expressed understanding and is in agreement w/ plan.

## 2024-04-18 ENCOUNTER — Other Ambulatory Visit (HOSPITAL_BASED_OUTPATIENT_CLINIC_OR_DEPARTMENT_OTHER): Payer: Self-pay

## 2024-04-18 MED ORDER — METFORMIN HCL ER 500 MG PO TB24
500.0000 mg | ORAL_TABLET | Freq: Every day | ORAL | 0 refills | Status: DC
  Filled 2024-04-18: qty 90, 90d supply, fill #0

## 2024-04-18 MED ORDER — ERGOCALCIFEROL 1.25 MG (50000 UT) PO CAPS
1.0000 | ORAL_CAPSULE | ORAL | 0 refills | Status: DC
  Filled 2024-04-18 – 2024-05-07 (×2): qty 12, 84d supply, fill #0

## 2024-04-29 ENCOUNTER — Other Ambulatory Visit (HOSPITAL_BASED_OUTPATIENT_CLINIC_OR_DEPARTMENT_OTHER): Payer: Self-pay

## 2024-05-06 ENCOUNTER — Other Ambulatory Visit (HOSPITAL_BASED_OUTPATIENT_CLINIC_OR_DEPARTMENT_OTHER): Payer: Self-pay

## 2024-05-06 MED ORDER — LEVOTHYROXINE SODIUM 75 MCG PO TABS
75.0000 ug | ORAL_TABLET | Freq: Every day | ORAL | 3 refills | Status: DC
  Filled 2024-05-06: qty 30, 30d supply, fill #0
  Filled 2024-06-10 – 2024-06-19 (×3): qty 30, 30d supply, fill #1
  Filled 2024-07-22 – 2024-08-01 (×3): qty 30, 30d supply, fill #2

## 2024-05-07 ENCOUNTER — Other Ambulatory Visit (HOSPITAL_BASED_OUTPATIENT_CLINIC_OR_DEPARTMENT_OTHER): Payer: Self-pay

## 2024-05-13 ENCOUNTER — Other Ambulatory Visit (HOSPITAL_BASED_OUTPATIENT_CLINIC_OR_DEPARTMENT_OTHER): Payer: Self-pay

## 2024-05-13 DIAGNOSIS — K30 Functional dyspepsia: Secondary | ICD-10-CM | POA: Diagnosis not present

## 2024-05-13 DIAGNOSIS — E063 Autoimmune thyroiditis: Secondary | ICD-10-CM | POA: Diagnosis not present

## 2024-05-13 DIAGNOSIS — I10 Essential (primary) hypertension: Secondary | ICD-10-CM | POA: Diagnosis not present

## 2024-05-13 DIAGNOSIS — E559 Vitamin D deficiency, unspecified: Secondary | ICD-10-CM | POA: Diagnosis not present

## 2024-05-13 DIAGNOSIS — E66812 Obesity, class 2: Secondary | ICD-10-CM | POA: Diagnosis not present

## 2024-05-13 DIAGNOSIS — R7303 Prediabetes: Secondary | ICD-10-CM | POA: Diagnosis not present

## 2024-05-13 DIAGNOSIS — Z6835 Body mass index (BMI) 35.0-35.9, adult: Secondary | ICD-10-CM | POA: Diagnosis not present

## 2024-05-13 DIAGNOSIS — E782 Mixed hyperlipidemia: Secondary | ICD-10-CM | POA: Diagnosis not present

## 2024-05-13 MED ORDER — METFORMIN HCL ER 500 MG PO TB24
500.0000 mg | ORAL_TABLET | Freq: Every day | ORAL | 0 refills | Status: DC
  Filled 2024-07-22 – 2024-08-01 (×3): qty 90, 90d supply, fill #0

## 2024-06-10 ENCOUNTER — Other Ambulatory Visit (HOSPITAL_BASED_OUTPATIENT_CLINIC_OR_DEPARTMENT_OTHER): Payer: Self-pay

## 2024-06-11 ENCOUNTER — Other Ambulatory Visit (HOSPITAL_BASED_OUTPATIENT_CLINIC_OR_DEPARTMENT_OTHER): Payer: Self-pay

## 2024-06-19 ENCOUNTER — Encounter: Payer: Self-pay | Admitting: Family Medicine

## 2024-06-19 ENCOUNTER — Other Ambulatory Visit (HOSPITAL_BASED_OUTPATIENT_CLINIC_OR_DEPARTMENT_OTHER): Payer: Self-pay

## 2024-06-19 ENCOUNTER — Ambulatory Visit: Admitting: Family Medicine

## 2024-06-19 VITALS — BP 108/70 | Temp 98.5°F | Wt 194.6 lb

## 2024-06-19 DIAGNOSIS — J029 Acute pharyngitis, unspecified: Secondary | ICD-10-CM

## 2024-06-19 DIAGNOSIS — R051 Acute cough: Secondary | ICD-10-CM

## 2024-06-19 DIAGNOSIS — J019 Acute sinusitis, unspecified: Secondary | ICD-10-CM

## 2024-06-19 LAB — POC COVID19 BINAXNOW: SARS Coronavirus 2 Ag: NEGATIVE

## 2024-06-19 LAB — POCT INFLUENZA A/B
Influenza A, POC: NEGATIVE
Influenza B, POC: NEGATIVE

## 2024-06-19 LAB — POCT RAPID STREP A (OFFICE): Rapid Strep A Screen: NEGATIVE

## 2024-06-19 MED ORDER — AMOXICILLIN 875 MG PO TABS
875.0000 mg | ORAL_TABLET | Freq: Two times a day (BID) | ORAL | 0 refills | Status: AC
  Filled 2024-06-19: qty 20, 10d supply, fill #0

## 2024-06-19 NOTE — Progress Notes (Signed)
 Subjective:  Patient ID: Pamela Bell, female    DOB: 02/19/1991  Age: 33 y.o. MRN: [id number]  CC:  Chief Complaint  Patient presents with   Cough    Onset monday   Sore Throat    HPI Pamela Bell presents for   Cough, sore throat Initial symptoms after flu vaccine 10/10. Some increased HA at times, postnasal drip, nasal congestion in am, improve during day.  Hx of migraine - having to take Nurtec every other day (usually 2x/ month), sinus pressure, pain, and ears feels like congestion. Sore throat.  Worse 2 days ago - more fatigue, felt off. Now with fever - off and on fever past 2 days - 101 this am, chills. Sinus pressure is worst symptom. Some productive cough this am.  Green nasal discharge in past 4-5 days.  No dyspnea.  Neg covid/flu home test 2 days ago.  No known sick contacts. Dtr with cough - possible asthma flare.  Able to take amoxicillin , not augmentin.  Has required amoxicillin  white tablet without regarding previously that pharmacy has been able to fill.  Attempted treatments: Tylenol , nurtec as above, vicks vaporub.      Immunization History  Administered Date(s) Administered   Hpv-Unspecified 04/03/2006, 06/15/2006, 10/17/2006   Influenza Inj Mdck Quad Pf 05/17/2022   Tdap 08/16/2022      History Patient Active Problem List   Diagnosis Date Noted   Hashimoto thyroiditis 04/16/2024   Mood disorder 04/16/2024   Gestational hypertension 11/04/2022   Chest pain 09/13/2022   Gestational diabetes mellitus 09/04/2022   Iron deficiency 03/21/2022   Attention deficit hyperactivity disorder 03/21/2022   Anxiety 03/21/2022   Abnormal EKG 02/10/2018   Past Medical History:  Diagnosis Date   Anemia    Anxiety    Bilateral ovarian cysts    Depression    takes zoloft    Gestational diabetes    Headache    PONV (postoperative nausea and vomiting)    UTI (urinary tract infection)    Past Surgical History:  Procedure Laterality Date    ADENOIDECTOMY AND MYRINGOTOMY WITH TUBE PLACEMENT     CESAREAN SECTION N/A 11/05/2022   Procedure: CESAREAN SECTION;  Surgeon: Diedre Rosaline BRAVO, MD;  Location: [facility];  Service: Obstetrics;  Laterality: N/A;   EYE SURGERY     Allergies  Allergen Reactions   Bee Venom Anaphylaxis   Cranberry Anaphylaxis   Augmentin [Amoxicillin -Pot Clavulanate]     As a child    Latex Rash   Red Dye #40 (Allura Red) Hives   Prior to Admission medications   Medication Sig Start Date End Date Taking? Authorizing Provider  ergocalciferol  (VITAMIN D2) 1.25 MG (50000 UT) capsule Take 1 capsule (50,000 Units total) by mouth once a week. 04/15/24  Yes McIntosh, Theodoro FALCON, MD  Ferrous Sulfate (IRON PO) Take by mouth.   Yes [provider]  levothyroxine  (SYNTHROID ) 75 MCG tablet Take 1 tablet (75 mcg total) by mouth daily on an empty stomach 05/06/24  Yes   loratadine (CLARITIN) 10 MG tablet Take 10 mg by mouth daily.   Yes [provider]  meloxicam  (MOBIC ) 15 MG tablet Take 1 tablet (15 mg total) by mouth daily. 04/16/24  Yes Tabori, Katherine E, MD  metFORMIN  (GLUCOPHAGE -XR) 500 MG 24 hr tablet Take 1 tablet (500 mg total) by mouth daily with supper. 05/13/24  Yes   Rimegepant Sulfate (NURTEC) 75 MG TBDP Take 1 tablet by mouth daily.   Yes [provider]  sertraline  (ZOLOFT ) 50 MG tablet Take 75 mg by mouth daily.   Yes [provider]  levothyroxine  (SYNTHROID ) 75 MCG tablet Take 1 tablet (75 mcg total) by mouth every morning on empty stomach 04/04/24     metFORMIN  (GLUMETZA ) 500 MG (MOD) 24 hr tablet Take 500 mg by mouth daily with breakfast.    [provider]  Vitamin D , Ergocalciferol , (DRISDOL ) 1.25 MG (50000 UNIT) CAPS capsule Take 50,000 Units by mouth every 7 (seven) days.    [provider]   Social History   Socioeconomic History   Marital status: Married    Spouse name: Not on file   Number of children: Not on file   Years of education: Not  on file   Highest education level: Associate degree: academic program  Occupational History   Not on file  Tobacco Use   Smoking status: Never   Smokeless tobacco: Never   Tobacco comments:    Quit with preg, social- not regular/dly user  Vaping Use   Vaping status: Former  Substance and Sexual Activity   Alcohol use: Not Currently   Drug use: Never   Sexual activity: Yes  Other Topics Concern   Not on file  Social History Narrative   Not on file   Social Drivers of Health   Financial Resource Strain: Medium Risk (06/16/2024)   Overall Financial Resource Strain (CARDIA)    Difficulty of Paying Living Expenses: Somewhat hard  Food Insecurity: No Food Insecurity (06/16/2024)   Hunger Vital Sign    Worried About Running Out of Food in the Last Year: Never true    Ran Out of Food in the Last Year: Never true  Transportation Needs: No Transportation Needs (06/16/2024)   PRAPARE - Administrator, Civil Service (Medical): No    Lack of Transportation (Non-Medical): No  Physical Activity: Sufficiently Active (06/16/2024)   Exercise Vital Sign    Days of Exercise per Week: 5 days    Minutes of Exercise per Session: 30 min  Recent Concern: Physical Activity - Insufficiently Active (04/14/2024)   Exercise Vital Sign    Days of Exercise per Week: 4 days    Minutes of Exercise per Session: 30 min  Stress: Stress Concern Present (06/16/2024)   [employer] of Occupational Health - Occupational Stress Questionnaire    Feeling of Stress: Rather much  Social Connections: Socially Integrated (06/16/2024)   Social Connection and Isolation Panel    Frequency of Communication with Friends and Family: More than three times a week    Frequency of Social Gatherings with Friends and Family: Once a week    Attends Religious Services: More than 4 times per year    Active Member of [employer] or Organizations: Yes    Attends Banker Meetings: More than 4 times per year     Marital Status: Married  Catering manager Violence: Not At Risk (11/06/2022)   Humiliation, Afraid, Rape, and Kick questionnaire    Fear of Current or Ex-Partner: No    Emotionally Abused: No    Physically Abused: No    Sexually Abused: No    Review of Systems Per HPI  Objective:   Vitals:   06/19/24 0808  BP: 108/70  Temp: 98.5 F (36.9 C)  Weight: 194 lb 9.6 oz (88.3 kg)     Physical Exam Vitals reviewed.  Constitutional:      General: She is not in acute distress.    Appearance: She is  well-developed.  HENT:     Head: Normocephalic and atraumatic.     Right Ear: Hearing, tympanic membrane, ear canal and external ear normal.     Left Ear: Hearing, tympanic membrane, ear canal and external ear normal.     Nose: Nose normal.     Comments: Tender to percussion over frontal and maxillary sinus on the left.    Mouth/Throat:     Pharynx: No posterior oropharyngeal erythema.     Tonsils: No tonsillar exudate or tonsillar abscesses.  Eyes:     Conjunctiva/sclera: Conjunctivae normal.     Pupils: Pupils are equal, round, and reactive to light.  Cardiovascular:     Rate and Rhythm: Normal rate and regular rhythm.     Heart sounds: Normal heart sounds. No murmur heard. Pulmonary:     Effort: Pulmonary effort is normal. No respiratory distress.     Breath sounds: Normal breath sounds. No stridor. No wheezing, rhonchi or rales.  Lymphadenopathy:     Cervical: No cervical adenopathy.  Skin:    General: Skin is warm and dry.     Findings: No rash.  Neurological:     Mental Status: She is alert and oriented to person, place, and time.  Psychiatric:        Mood and Affect: Mood normal.        Behavior: Behavior normal.      Results for orders placed or performed in visit on 06/19/24  POC COVID-19 BinaxNow   Collection Time: 06/19/24  8:24 AM  Result Value Ref Range   SARS Coronavirus 2 Ag Negative Negative  POCT Influenza A/B   Collection Time: 06/19/24  8:24 AM   Result Value Ref Range   Influenza A, POC Negative Negative   Influenza B, POC Negative Negative  POCT rapid strep A   Collection Time: 06/19/24  8:24 AM  Result Value Ref Range   Rapid Strep A Screen Negative Negative     Assessment & Plan:  BEKKA QIAN is a 33 y.o. female . Acute cough - Plan: POC COVID-19 BinaxNow, POCT Influenza A/B, POCT rapid strep A, amoxicillin  (AMOXIL ) 875 MG tablet  Sore throat - Plan: POC COVID-19 BinaxNow, POCT Influenza A/B, POCT rapid strep A, amoxicillin  (AMOXIL ) 875 MG tablet  Acute non-recurrent sinusitis, unspecified location - Plan: amoxicillin  (AMOXIL ) 875 MG tablet  Suspected viral syndrome versus side effects from influenza vaccine previously although those symptoms lasted longer than I would expect for temporary side effects after influenza injection.  Allergies versus viral syndrome followed by secondary sickening few days ago, left frontal and maxillary sinus pain and discolored nasal discharge, acute sinusitis likely at this time.  Reassuring exam.  Lungs clear.  Negative COVID, flu, strep testing as above.  - Symptomatic care discussed with fluids, rest, over-the-counter meds including saline nasal spray, Flonase if needed.  Declined additional medication to help with cough at night.  - Start high-dose amoxicillin  as intolerant to Augmentin.  Did request without red dye.  Update on symptoms next few days with RTC precautions given.  Meds ordered this encounter  Medications   amoxicillin  (AMOXIL ) 875 MG tablet    Sig: Take 1 tablet (875 mg total) by mouth 2 (two) times daily for 10 days.    Dispense:  20 tablet    Refill:  0    Able to tolerate amoxicillin , no Augmentin.  Red dye allergy, please dispense white tablets or tablets without red dye.   Patient Instructions  Testing in the  office was negative.  I suspect you had an initial viral syndrome with secondary sinus infection.  I did order the high-dose amoxicillin , and requested  without red dye.  Take that twice per day.  See the information below about sinus infections.  Saline nasal spray may be helpful.  Okay to continue antihistamine if that is helpful.  Cepacol or other sore throat lozenges may help, teaspoon of honey at bedtime can sometimes help cough, sore throat as well.  Flonase is an option if that helps.  Make sure to drink plenty of fluids and rest, keep me updated on your symptoms and if something additionally needed to help with cough at bedtime.  Get better soon!  Sinus Infection, Adult A sinus infection, also called sinusitis, is inflammation of your sinuses. Sinuses are hollow spaces in the bones around your face. Your sinuses are located: Around your eyes. In the middle of your forehead. Behind your nose. In your cheekbones. Mucus normally drains out of your sinuses. When your nasal tissues become inflamed or swollen, mucus can become trapped or blocked. This allows bacteria, viruses, and fungi to grow, which leads to infection. Most infections of the sinuses are caused by a virus. A sinus infection can develop quickly. It can last for up to 4 weeks (acute) or for more than 12 weeks (chronic). A sinus infection often develops after a cold. What are the causes? This condition is caused by anything that creates swelling in the sinuses or stops mucus from draining. This includes: Allergies. Asthma. Infection from bacteria or viruses. Deformities or blockages in your nose or sinuses. Abnormal growths in the nose (nasal polyps). Pollutants, such as chemicals or irritants in the air. Infection from fungi. This is rare. What increases the risk? You are more likely to develop this condition if you: Have a weak body defense system (immune system). Do a lot of swimming or diving. Overuse nasal sprays. Smoke. What are the signs or symptoms? The main symptoms of this condition are pain and a feeling of pressure around the affected sinuses. Other symptoms  include: Stuffy nose or congestion that makes it difficult to breathe through your nose. Thick yellow or greenish drainage from your nose. Tenderness, swelling, and warmth over the affected sinuses. A cough that may get worse at night. Decreased sense of smell and taste. Extra mucus that collects in the throat or the back of the nose (postnasal drip) causing a sore throat or bad breath. Tiredness (fatigue). Fever. How is this diagnosed? This condition is diagnosed based on: Your symptoms. Your medical history. A physical exam. Tests to find out if your condition is acute or chronic. This may include: Checking your nose for nasal polyps. Viewing your sinuses using a device that has a light (endoscope). Testing for allergies or bacteria. Imaging tests, such as an MRI or CT scan. In rare cases, a bone biopsy may be done to rule out more serious types of fungal sinus disease. How is this treated? Treatment for a sinus infection depends on the cause and whether your condition is chronic or acute. If caused by a virus, your symptoms should go away on their own within 10 days. You may be given medicines to relieve symptoms. They include: Medicines that shrink swollen nasal passages (decongestants). A spray that eases inflammation of the nostrils (topical intranasal corticosteroids). Rinses that help get rid of thick mucus in your nose (nasal saline washes). Medicines that treat allergies (antihistamines). Over-the-counter pain relievers. If caused by bacteria, your health  care provider may recommend waiting to see if your symptoms improve. Most bacterial infections will get better without antibiotic medicine. You may be given antibiotics if you have: A severe infection. A weak immune system. If caused by narrow nasal passages or nasal polyps, surgery may be needed. Follow these instructions at home: Medicines Take, use, or apply over-the-counter and prescription medicines only as told by  your health care provider. These may include nasal sprays. If you were prescribed an antibiotic medicine, take it as told by your health care provider. Do not stop taking the antibiotic even if you start to feel better. Hydrate and humidify  Drink enough fluid to keep your urine pale yellow. Staying hydrated will help to thin your mucus. Use a cool mist humidifier to keep the humidity level in your home above 50%. Inhale steam for 10-15 minutes, 3-4 times a day, or as told by your health care provider. You can do this in the bathroom while a hot shower is running. Limit your exposure to cool or dry air. Rest Rest as much as possible. Sleep with your head raised (elevated). Make sure you get enough sleep each night. General instructions  Apply a warm, moist washcloth to your face 3-4 times a day or as told by your health care provider. This will help with discomfort. Use nasal saline washes as often as told by your health care provider. Wash your hands often with soap and water  to reduce your exposure to germs. If soap and water  are not available, use hand sanitizer. Do not smoke. Avoid being around people who are smoking (secondhand smoke). Keep all follow-up visits. This is important. Contact a health care provider if: You have a fever. Your symptoms get worse. Your symptoms do not improve within 10 days. Get help right away if: You have a severe headache. You have persistent vomiting. You have severe pain or swelling around your face or eyes. You have vision problems. You develop confusion. Your neck is stiff. You have trouble breathing. These symptoms may be an emergency. Get help right away. Call 911. Do not wait to see if the symptoms will go away. Do not drive yourself to the hospital. Summary A sinus infection is soreness and inflammation of your sinuses. Sinuses are hollow spaces in the bones around your face. This condition is caused by nasal tissues that become inflamed  or swollen. The swelling traps or blocks the flow of mucus. This allows bacteria, viruses, and fungi to grow, which leads to infection. If you were prescribed an antibiotic medicine, take it as told by your health care provider. Do not stop taking the antibiotic even if you start to feel better. Keep all follow-up visits. This is important. This information is not intended to replace advice given to you by your health care provider. Make sure you discuss any questions you have with your health care provider. Document Revised: 07/19/2021 Document Reviewed: 07/19/2021 Elsevier Patient Education  2024 Elsevier Inc.    Signed,   Reyes Pines, MD [facility], [facility] 06/19/24 8:50 AM

## 2024-06-19 NOTE — Patient Instructions (Signed)
 Testing in the office was negative.  I suspect you had an initial viral syndrome with secondary sinus infection.  I did order the high-dose amoxicillin , and requested without red dye.  Take that twice per day.  See the information below about sinus infections.  Saline nasal spray may be helpful.  Okay to continue antihistamine if that is helpful.  Cepacol or other sore throat lozenges may help, teaspoon of honey at bedtime can sometimes help cough, sore throat as well.  Flonase is an option if that helps.  Make sure to drink plenty of fluids and rest, keep me updated on your symptoms and if something additionally needed to help with cough at bedtime.  Get better soon!  Sinus Infection, Adult A sinus infection, also called sinusitis, is inflammation of your sinuses. Sinuses are hollow spaces in the bones around your face. Your sinuses are located: Around your eyes. In the middle of your forehead. Behind your nose. In your cheekbones. Mucus normally drains out of your sinuses. When your nasal tissues become inflamed or swollen, mucus can become trapped or blocked. This allows bacteria, viruses, and fungi to grow, which leads to infection. Most infections of the sinuses are caused by a virus. A sinus infection can develop quickly. It can last for up to 4 weeks (acute) or for more than 12 weeks (chronic). A sinus infection often develops after a cold. What are the causes? This condition is caused by anything that creates swelling in the sinuses or stops mucus from draining. This includes: Allergies. Asthma. Infection from bacteria or viruses. Deformities or blockages in your nose or sinuses. Abnormal growths in the nose (nasal polyps). Pollutants, such as chemicals or irritants in the air. Infection from fungi. This is rare. What increases the risk? You are more likely to develop this condition if you: Have a weak body defense system (immune system). Do a lot of swimming or diving. Overuse nasal  sprays. Smoke. What are the signs or symptoms? The main symptoms of this condition are pain and a feeling of pressure around the affected sinuses. Other symptoms include: Stuffy nose or congestion that makes it difficult to breathe through your nose. Thick yellow or greenish drainage from your nose. Tenderness, swelling, and warmth over the affected sinuses. A cough that may get worse at night. Decreased sense of smell and taste. Extra mucus that collects in the throat or the back of the nose (postnasal drip) causing a sore throat or bad breath. Tiredness (fatigue). Fever. How is this diagnosed? This condition is diagnosed based on: Your symptoms. Your medical history. A physical exam. Tests to find out if your condition is acute or chronic. This may include: Checking your nose for nasal polyps. Viewing your sinuses using a device that has a light (endoscope). Testing for allergies or bacteria. Imaging tests, such as an MRI or CT scan. In rare cases, a bone biopsy may be done to rule out more serious types of fungal sinus disease. How is this treated? Treatment for a sinus infection depends on the cause and whether your condition is chronic or acute. If caused by a virus, your symptoms should go away on their own within 10 days. You may be given medicines to relieve symptoms. They include: Medicines that shrink swollen nasal passages (decongestants). A spray that eases inflammation of the nostrils (topical intranasal corticosteroids). Rinses that help get rid of thick mucus in your nose (nasal saline washes). Medicines that treat allergies (antihistamines). Over-the-counter pain relievers. If caused by bacteria,  your health care provider may recommend waiting to see if your symptoms improve. Most bacterial infections will get better without antibiotic medicine. You may be given antibiotics if you have: A severe infection. A weak immune system. If caused by narrow nasal passages or  nasal polyps, surgery may be needed. Follow these instructions at home: Medicines Take, use, or apply over-the-counter and prescription medicines only as told by your health care provider. These may include nasal sprays. If you were prescribed an antibiotic medicine, take it as told by your health care provider. Do not stop taking the antibiotic even if you start to feel better. Hydrate and humidify  Drink enough fluid to keep your urine pale yellow. Staying hydrated will help to thin your mucus. Use a cool mist humidifier to keep the humidity level in your home above 50%. Inhale steam for 10-15 minutes, 3-4 times a day, or as told by your health care provider. You can do this in the bathroom while a hot shower is running. Limit your exposure to cool or dry air. Rest Rest as much as possible. Sleep with your head raised (elevated). Make sure you get enough sleep each night. General instructions  Apply a warm, moist washcloth to your face 3-4 times a day or as told by your health care provider. This will help with discomfort. Use nasal saline washes as often as told by your health care provider. Wash your hands often with soap and water  to reduce your exposure to germs. If soap and water  are not available, use hand sanitizer. Do not smoke. Avoid being around people who are smoking (secondhand smoke). Keep all follow-up visits. This is important. Contact a health care provider if: You have a fever. Your symptoms get worse. Your symptoms do not improve within 10 days. Get help right away if: You have a severe headache. You have persistent vomiting. You have severe pain or swelling around your face or eyes. You have vision problems. You develop confusion. Your neck is stiff. You have trouble breathing. These symptoms may be an emergency. Get help right away. Call 911. Do not wait to see if the symptoms will go away. Do not drive yourself to the hospital. Summary A sinus infection is  soreness and inflammation of your sinuses. Sinuses are hollow spaces in the bones around your face. This condition is caused by nasal tissues that become inflamed or swollen. The swelling traps or blocks the flow of mucus. This allows bacteria, viruses, and fungi to grow, which leads to infection. If you were prescribed an antibiotic medicine, take it as told by your health care provider. Do not stop taking the antibiotic even if you start to feel better. Keep all follow-up visits. This is important. This information is not intended to replace advice given to you by your health care provider. Make sure you discuss any questions you have with your health care provider. Document Revised: 07/19/2021 Document Reviewed: 07/19/2021 Elsevier Patient Education  2024 [employer].

## 2024-06-23 ENCOUNTER — Encounter: Payer: Self-pay | Admitting: Physician Assistant

## 2024-06-23 ENCOUNTER — Ambulatory Visit: Payer: Self-pay | Admitting: Physician Assistant

## 2024-06-23 ENCOUNTER — Other Ambulatory Visit (HOSPITAL_BASED_OUTPATIENT_CLINIC_OR_DEPARTMENT_OTHER): Payer: Self-pay

## 2024-06-23 VITALS — BP 127/87 | HR 64 | Temp 98.1°F | Ht 62.0 in | Wt 199.0 lb

## 2024-06-23 DIAGNOSIS — E66812 Obesity, class 2: Secondary | ICD-10-CM | POA: Insufficient documentation

## 2024-06-23 DIAGNOSIS — G43909 Migraine, unspecified, not intractable, without status migrainosus: Secondary | ICD-10-CM | POA: Insufficient documentation

## 2024-06-23 DIAGNOSIS — F3340 Major depressive disorder, recurrent, in remission, unspecified: Secondary | ICD-10-CM | POA: Diagnosis not present

## 2024-06-23 DIAGNOSIS — D500 Iron deficiency anemia secondary to blood loss (chronic): Secondary | ICD-10-CM | POA: Insufficient documentation

## 2024-06-23 DIAGNOSIS — E559 Vitamin D deficiency, unspecified: Secondary | ICD-10-CM | POA: Insufficient documentation

## 2024-06-23 DIAGNOSIS — Z7689 Persons encountering health services in other specified circumstances: Secondary | ICD-10-CM

## 2024-06-23 MED ORDER — SERTRALINE HCL 50 MG PO TABS
50.0000 mg | ORAL_TABLET | Freq: Every day | ORAL | 0 refills | Status: DC
  Filled 2024-06-23: qty 30, 30d supply, fill #0

## 2024-06-23 NOTE — Assessment & Plan Note (Signed)
 On Zoloft  50 mg daily for two years, experiencing brain fog and fatigue, possibly due to Zoloft . Interested in tapering to evaluate condition without medication. - Taper Zoloft  to 25 mg daily for two weeks. - Then taper to 12.5 mg daily or 25 mg every other day for two weeks if 1/4 is too small to cut.  - Provided information on potential withdrawal symptoms, including headaches if a dose is missed. - Follow up in 2-3 months to assess condition post-tapering. - Advised to contact via MyChart if she wishes to restart Zoloft  or if issues arise.

## 2024-06-23 NOTE — Patient Instructions (Signed)
 Based upon results from multiple studies, the most common discontinuation symptoms appear to be:  Dizziness  Headache  Insomnia  Irritability  Nausea  Other common discontinuation symptoms include:  Agitation  Anxiety  Chills without fever  Diaphoresis  Dysphoria  Fatigue  Lethargy  Myalgias  Rhinorrhea  Sensory disturbances such as:  -Paresthesias  -Electric shock-like sensations  Tremor  Vivid dreams  Less common or rare symptoms include anorexia, loss of balance, cognitive impairment, crying spells, dry mouth, loose stools/diarrhea, extrapyramidal symptoms, hypertension, hypomania and mania, psychosis (eg, auditory and visual hallucinations), sexual dysfunction, somnolence, suicidal ideation, tinnitus, and vomiting.

## 2024-06-23 NOTE — Progress Notes (Signed)
 New Patient Office Visit  Subjective    Patient ID: Pamela Bell, female    DOB: 03/22/1991  Age: 33 y.o. MRN: [id number]  CC:  Chief Complaint  Patient presents with   New Patient (Initial Visit)    Pt. Is wanting to discuss getting off zoloft . It's making her have no energy what so ever and not getting a lot of sleep.     HPI Pamela Bell presents to establish care  Discussed the use of AI scribe software for clinical note transcription with the patient, who gave verbal consent to proceed.  History of Present Illness Pamela Bell is a 33 year old female who presents to discuss her Zoloft  use.  She has been taking Zoloft  since her pregnancy six years ago and is currently on 50 mg daily, reduced from 75 mg two years ago due to worsening symptoms at the higher dose. She experiences brain fog, forgetfulness, and persistent fatigue, questioning if Zoloft  is the cause. Missing a dose results in a headache.  She was diagnosed with Hashimoto's thyroiditis within the past six months and is on 75 mcg of Synthroid , with stable thyroid  levels. Despite this, she continues to experience fatigue.  She has a history of gestational diabetes and is on metformin  as a preventive measure. Her A1c levels are stable, and she tolerates metformin  well.  She has a history of migraines, using Nurtec as needed, and recently had a viral illness with negative COVID, flu, and strep tests, completing a course of amoxicillin . She feels fine now. She received her flu shot and has a Pap smear scheduled.    Outpatient Encounter Medications as of 06/23/2024  Medication Sig   amoxicillin  (AMOXIL ) 875 MG tablet Take 1 tablet (875 mg total) by mouth 2 (two) times daily for 10 days.   ergocalciferol  (VITAMIN D2) 1.25 MG (50000 UT) capsule Take 1 capsule (50,000 Units total) by mouth once a week.   Ferrous Sulfate (IRON PO) Take by mouth.   levothyroxine  (SYNTHROID ) 75 MCG tablet Take 1 tablet (75 mcg total)  by mouth daily on an empty stomach   loratadine (CLARITIN) 10 MG tablet Take 10 mg by mouth daily.   metFORMIN  (GLUCOPHAGE -XR) 500 MG 24 hr tablet Take 1 tablet (500 mg total) by mouth daily with supper.   Rimegepant Sulfate (NURTEC) 75 MG TBDP Take 1 tablet by mouth daily.   [DISCONTINUED] sertraline  (ZOLOFT ) 50 MG tablet Take 75 mg by mouth daily.   sertraline  (ZOLOFT ) 50 MG tablet Take 1 tablet (50 mg total) by mouth daily.   [DISCONTINUED] meloxicam  (MOBIC ) 15 MG tablet Take 1 tablet (15 mg total) by mouth daily. (Patient not taking: Reported on 06/23/2024)   No facility-administered encounter medications on file as of 06/23/2024.    Past Medical History:  Diagnosis Date   Anemia    Anxiety    Bilateral ovarian cysts    Depression    takes zoloft    Gestational diabetes    Headache    PONV (postoperative nausea and vomiting)    UTI (urinary tract infection)     Past Surgical History:  Procedure Laterality Date   ADENOIDECTOMY AND MYRINGOTOMY WITH TUBE PLACEMENT     CESAREAN SECTION N/A 11/05/2022   Procedure: CESAREAN SECTION;  Surgeon: Diedre Rosaline BRAVO, MD;  Location: [facility] ORS;  Service: Obstetrics;  Laterality: N/A;   EYE SURGERY      Family History  Problem Relation Age of Onset   Depression Mother  Anxiety disorder Mother    Hypertension Mother    Asthma Mother    Hypothyroidism Mother    Anxiety disorder Father    Asthma Father    Cancer Paternal Grandfather    ADD / ADHD Neg Hx    Alcohol abuse Neg Hx    Arthritis Neg Hx    Birth defects Neg Hx    COPD Neg Hx    Diabetes Neg Hx    Early death Neg Hx    Drug abuse Neg Hx    Hearing loss Neg Hx    Heart disease Neg Hx    Hyperlipidemia Neg Hx    Intellectual disability Neg Hx    Kidney disease Neg Hx    Learning disabilities Neg Hx    Miscarriages / Stillbirths Neg Hx    Obesity Neg Hx    Stroke Neg Hx    Vision loss Neg Hx    Varicose Veins Neg Hx     Social History   Socioeconomic  History   Marital status: Married    Spouse name: Not on file   Number of children: Not on file   Years of education: Not on file   Highest education level: Associate degree: academic program  Occupational History   Not on file  Tobacco Use   Smoking status: Never   Smokeless tobacco: Never   Tobacco comments:    Quit with preg, social- not regular/dly user  Vaping Use   Vaping status: Former  Substance and Sexual Activity   Alcohol use: Not Currently   Drug use: Never   Sexual activity: Yes  Other Topics Concern   Not on file  Social History Narrative   Not on file   Social Drivers of Health   Financial Resource Strain: Medium Risk (06/16/2024)   Overall Financial Resource Strain (CARDIA)    Difficulty of Paying Living Expenses: Somewhat hard  Food Insecurity: No Food Insecurity (06/16/2024)   Hunger Vital Sign    Worried About Running Out of Food in the Last Year: Never true    Ran Out of Food in the Last Year: Never true  Transportation Needs: No Transportation Needs (06/16/2024)   PRAPARE - Administrator, Civil Service (Medical): No    Lack of Transportation (Non-Medical): No  Physical Activity: Sufficiently Active (06/16/2024)   Exercise Vital Sign    Days of Exercise per Week: 5 days    Minutes of Exercise per Session: 30 min  Recent Concern: Physical Activity - Insufficiently Active (04/14/2024)   Exercise Vital Sign    Days of Exercise per Week: 4 days    Minutes of Exercise per Session: 30 min  Stress: Stress Concern Present (06/16/2024)   [employer] of Occupational Health - Occupational Stress Questionnaire    Feeling of Stress: Rather much  Social Connections: Socially Integrated (06/16/2024)   Social Connection and Isolation Panel    Frequency of Communication with Friends and Family: More than three times a week    Frequency of Social Gatherings with Friends and Family: Once a week    Attends Religious Services: More than 4 times per  year    Active Member of [employer] or Organizations: Yes    Attends Banker Meetings: More than 4 times per year    Marital Status: Married  Catering Manager Violence: Not At Risk (11/06/2022)   Humiliation, Afraid, Rape, and Kick questionnaire    Fear of Current or Ex-Partner: No    Emotionally Abused:  No    Physically Abused: No    Sexually Abused: No    Review of Systems  Constitutional:  Positive for malaise/fatigue. Negative for chills and fever.  Respiratory:  Negative for cough and shortness of breath.   Cardiovascular:  Negative for chest pain and palpitations.  Musculoskeletal:  Negative for joint pain and myalgias.  Neurological:  Positive for headaches. Negative for dizziness.  Psychiatric/Behavioral:  Negative for depression. The patient is not nervous/anxious.         Objective    BP 127/87 (BP Location: Left Arm, Patient Position: Sitting)   Pulse 64   Temp 98.1 F (36.7 C)   Ht 5' 2 (1.575 m)   Wt 199 lb (90.3 kg)   SpO2 100%   BMI 36.40 kg/m   Physical Exam Constitutional:      Appearance: Normal appearance. She is obese.  HENT:     Head: Normocephalic and atraumatic.     Mouth/Throat:     Mouth: Mucous membranes are moist.     Pharynx: Oropharynx is clear.  Eyes:     Extraocular Movements: Extraocular movements intact.     Conjunctiva/sclera: Conjunctivae normal.  Cardiovascular:     Rate and Rhythm: Normal rate and regular rhythm.     Heart sounds: Normal heart sounds. No murmur heard. Pulmonary:     Effort: Pulmonary effort is normal.     Breath sounds: Normal breath sounds.  Skin:    General: Skin is warm and dry.  Neurological:     General: No focal deficit present.     Mental Status: She is alert and oriented to person, place, and time.  Psychiatric:        Mood and Affect: Mood normal.        Behavior: Behavior normal.       Assessment & Plan:  Encounter to establish care  Recurrent major depression in  remission Assessment & Plan: On Zoloft  50 mg daily for two years, experiencing brain fog and fatigue, possibly due to Zoloft . Interested in tapering to evaluate condition without medication. - Taper Zoloft  to 25 mg daily for two weeks. - Then taper to 12.5 mg daily or 25 mg every other day for two weeks if 1/4 is too small to cut.  - Provided information on potential withdrawal symptoms, including headaches if a dose is missed. - Follow up in 2-3 months to assess condition post-tapering. - Advised to contact via MyChart if she wishes to restart Zoloft  or if issues arise.  Orders: -     Sertraline  HCl; Take 1 tablet (50 mg total) by mouth daily.  Dispense: 30 tablet; Refill: 0    Return in about 3 months (around 09/23/2024) for med f/u- off Zoloft  .   Charmaine Delshon Blanchfield, PA-C

## 2024-06-25 DIAGNOSIS — Z1389 Encounter for screening for other disorder: Secondary | ICD-10-CM | POA: Diagnosis not present

## 2024-06-25 DIAGNOSIS — Z975 Presence of (intrauterine) contraceptive device: Secondary | ICD-10-CM | POA: Diagnosis not present

## 2024-06-25 DIAGNOSIS — Z130 Encounter for screening for diseases of the blood and blood-forming organs and certain disorders involving the immune mechanism: Secondary | ICD-10-CM | POA: Diagnosis not present

## 2024-06-25 DIAGNOSIS — Z124 Encounter for screening for malignant neoplasm of cervix: Secondary | ICD-10-CM | POA: Diagnosis not present

## 2024-06-25 DIAGNOSIS — Z01419 Encounter for gynecological examination (general) (routine) without abnormal findings: Secondary | ICD-10-CM | POA: Diagnosis not present

## 2024-07-02 ENCOUNTER — Encounter: Payer: Self-pay | Admitting: Physician Assistant

## 2024-07-02 ENCOUNTER — Other Ambulatory Visit: Payer: Self-pay | Admitting: Physician Assistant

## 2024-07-02 ENCOUNTER — Other Ambulatory Visit (HOSPITAL_BASED_OUTPATIENT_CLINIC_OR_DEPARTMENT_OTHER): Payer: Self-pay

## 2024-07-02 ENCOUNTER — Other Ambulatory Visit (HOSPITAL_COMMUNITY): Payer: Self-pay

## 2024-07-02 MED ORDER — NURTEC 75 MG PO TBDP
1.0000 | ORAL_TABLET | Freq: Every day | ORAL | 0 refills | Status: DC
  Filled 2024-07-02: qty 30, 30d supply, fill #0
  Filled 2024-07-07 – 2024-08-01 (×4): qty 18, 30d supply, fill #0

## 2024-07-02 NOTE — Telephone Encounter (Signed)
 Appointment scheduled.

## 2024-07-03 ENCOUNTER — Other Ambulatory Visit (HOSPITAL_BASED_OUTPATIENT_CLINIC_OR_DEPARTMENT_OTHER): Payer: Self-pay

## 2024-07-03 ENCOUNTER — Telehealth: Payer: Self-pay | Admitting: Pharmacy Technician

## 2024-07-03 ENCOUNTER — Telehealth (INDEPENDENT_AMBULATORY_CARE_PROVIDER_SITE_OTHER): Admitting: Physician Assistant

## 2024-07-03 ENCOUNTER — Encounter: Payer: Self-pay | Admitting: Physician Assistant

## 2024-07-03 DIAGNOSIS — G43E19 Chronic migraine with aura, intractable, without status migrainosus: Secondary | ICD-10-CM | POA: Diagnosis not present

## 2024-07-03 MED ORDER — AMITRIPTYLINE HCL 25 MG PO TABS
25.0000 mg | ORAL_TABLET | Freq: Every day | ORAL | 1 refills | Status: DC
  Filled 2024-07-03: qty 30, 30d supply, fill #0
  Filled 2024-07-28 – 2024-08-01 (×3): qty 30, 30d supply, fill #1

## 2024-07-03 MED ORDER — SERTRALINE HCL 25 MG PO TABS
25.0000 mg | ORAL_TABLET | Freq: Every day | ORAL | 0 refills | Status: DC
  Filled 2024-07-03: qty 15, 15d supply, fill #0

## 2024-07-03 NOTE — Telephone Encounter (Signed)
 Pharmacy Patient Advocate Encounter   Received notification from Onbase that prior authorization for Nurtec 75MG  dispersible tablets is required/requested.   Insurance verification completed.   The patient is insured through [facility].   Per test claim: PA required; PA submitted to above mentioned insurance via Latent Key/confirmation #/EOC [id number] Status is pending

## 2024-07-03 NOTE — Progress Notes (Signed)
 Virtual Visit via Video Note  I connected with Pamela Bell on 07/03/24 at 11:20 AM EST by a video enabled telemedicine application and verified that I am speaking with the correct person using two identifiers.  Patient Location: Other:  work Dispensing Optician: Office/Clinic  I discussed the limitations, risks, security, and privacy concerns of performing an evaluation and management service by video and the availability of in person appointments. I also discussed with the patient that there may be a patient responsible charge related to this service. The patient expressed understanding and agreed to proceed.  Subjective: PCP: Mayford Alberg, PA-C  No chief complaint on file.  Patient presents today with complaints of increased frequency of headaches and migraines. She reports Tuesday she had the worst migraine she has had in some time. Relating associated eye pain and nausea. She states she has been experiencing headaches or migraines at least every other day, some times every day, since our initial visit. She states Tylenol  is often effective, but recently she has been needing to take her Nurtec more frequently. She is currently tapering off Zolfot. She is interesting in preventative migraine medication.     ROS: Per HPI  Current Outpatient Medications:    amitriptyline (ELAVIL) 25 MG tablet, Take 1 tablet (25 mg total) by mouth at bedtime., Disp: 30 tablet, Rfl: 1   sertraline  (ZOLOFT ) 25 MG tablet, Take 1 tablet (25 mg total) by mouth daily., Disp: 15 tablet, Rfl: 0   ergocalciferol  (VITAMIN D2) 1.25 MG (50000 UT) capsule, Take 1 capsule (50,000 Units total) by mouth once a week., Disp: 12 capsule, Rfl: 0   Ferrous Sulfate (IRON PO), Take by mouth., Disp: , Rfl:    levothyroxine  (SYNTHROID ) 75 MCG tablet, Take 1 tablet (75 mcg total) by mouth daily on an empty stomach, Disp: 30 tablet, Rfl: 3   loratadine (CLARITIN) 10 MG tablet, Take 10 mg by mouth daily., Disp: , Rfl:     metFORMIN  (GLUCOPHAGE -XR) 500 MG 24 hr tablet, Take 1 tablet (500 mg total) by mouth daily with supper., Disp: 90 tablet, Rfl: 0   Rimegepant Sulfate (NURTEC) 75 MG TBDP, Take 1 tablet (75 mg total) by mouth daily., Disp: 30 tablet, Rfl: 0  Observations/Objective: There were no vitals filed for this visit. Physical Exam Constitutional:      General: She is not in acute distress.    Appearance: Normal appearance. She is not ill-appearing.  Eyes:     Extraocular Movements: Extraocular movements intact.  Pulmonary:     Effort: Pulmonary effort is normal.  Musculoskeletal:     Cervical back: Normal range of motion.  Skin:    General: Skin is warm.     Coloration: Skin is not pale.  Neurological:     Mental Status: She is alert and oriented to person, place, and time.     Assessment and Plan: Intractable chronic migraine with aura and without status migrainosus Assessment & Plan: Patient presents today with worsening frequency of headaches and migraines. Requiring abortive therapy (Nurtec) at least every other day. Associated eye pain/pressure and nausea reported. Today she appears well. Discussed daily preventative medication due to frequency of migraines. Patient agreeable. Starting on amitriptyline 25 mg at bedtime. Continue Nurtec and Tylenol  as needed. Monitor for worsening symptoms. Follow up in 6 weeks.   Orders: -     Amitriptyline HCl; Take 1 tablet (25 mg total) by mouth at bedtime.  Dispense: 30 tablet; Refill: 1  Other orders -  Sertraline  HCl; Take 1 tablet (25 mg total) by mouth daily.  Dispense: 15 tablet; Refill: 0    Follow Up Instructions: Return in about 6 weeks (around 08/14/2024).   I discussed the assessment and treatment plan with the patient. The patient was provided an opportunity to ask questions, and all were answered. The patient agreed with the plan and demonstrated an understanding of the instructions.   The patient was advised to call back or seek  an in-person evaluation if the symptoms worsen or if the condition fails to improve as anticipated.  The above assessment and management plan was discussed with the patient. The patient verbalized understanding of and has agreed to the management plan.   Charmaine Ossie Beltran, PA-C

## 2024-07-03 NOTE — Assessment & Plan Note (Signed)
 Patient presents today with worsening frequency of headaches and migraines. Requiring abortive therapy (Nurtec) at least every other day. Associated eye pain/pressure and nausea reported. Today she appears well. Discussed daily preventative medication due to frequency of migraines. Patient agreeable. Starting on amitriptyline 25 mg at bedtime. Continue Nurtec and Tylenol  as needed. Monitor for worsening symptoms. Follow up in 6 weeks.

## 2024-07-07 ENCOUNTER — Other Ambulatory Visit (HOSPITAL_COMMUNITY): Payer: Self-pay

## 2024-07-07 ENCOUNTER — Other Ambulatory Visit (HOSPITAL_BASED_OUTPATIENT_CLINIC_OR_DEPARTMENT_OTHER): Payer: Self-pay

## 2024-07-07 NOTE — Telephone Encounter (Signed)
 Pharmacy Patient Advocate Encounter  Received notification from [facility] that Prior Authorization for Nurtec 75MG  dispersible tablets has been APPROVED from 07/05/2024 to 01/01/2025. Ran test claim, Copay is $0.00. This test claim was processed through [facility]- copay amounts may vary at other pharmacies due to pharmacy/plan contracts, or as the patient moves through the different stages of their insurance plan.   PA #/Case ID/Reference #: [id number]  Approval is approved for 18 tablets every 30 days.

## 2024-07-08 ENCOUNTER — Other Ambulatory Visit (HOSPITAL_BASED_OUTPATIENT_CLINIC_OR_DEPARTMENT_OTHER): Payer: Self-pay

## 2024-07-22 ENCOUNTER — Other Ambulatory Visit (HOSPITAL_BASED_OUTPATIENT_CLINIC_OR_DEPARTMENT_OTHER): Payer: Self-pay

## 2024-08-01 ENCOUNTER — Other Ambulatory Visit (HOSPITAL_BASED_OUTPATIENT_CLINIC_OR_DEPARTMENT_OTHER): Payer: Self-pay

## 2024-08-19 ENCOUNTER — Ambulatory Visit: Admitting: Physician Assistant

## 2024-09-01 ENCOUNTER — Encounter: Payer: Self-pay | Admitting: Physician Assistant

## 2024-09-08 ENCOUNTER — Other Ambulatory Visit: Payer: Self-pay | Admitting: Physician Assistant

## 2024-09-08 ENCOUNTER — Encounter (HOSPITAL_COMMUNITY): Payer: Self-pay | Admitting: Pharmacist

## 2024-09-08 ENCOUNTER — Telehealth: Payer: Self-pay | Admitting: Physician Assistant

## 2024-09-08 ENCOUNTER — Other Ambulatory Visit (HOSPITAL_COMMUNITY): Payer: Self-pay

## 2024-09-08 ENCOUNTER — Encounter: Payer: Self-pay | Admitting: Physician Assistant

## 2024-09-08 DIAGNOSIS — G43E19 Chronic migraine with aura, intractable, without status migrainosus: Secondary | ICD-10-CM

## 2024-09-08 MED ORDER — AMITRIPTYLINE HCL 25 MG PO TABS
25.0000 mg | ORAL_TABLET | Freq: Every day | ORAL | 1 refills | Status: DC
  Filled 2024-09-08 – 2024-09-10 (×4): qty 90, 90d supply, fill #0

## 2024-09-09 ENCOUNTER — Other Ambulatory Visit (HOSPITAL_COMMUNITY): Payer: Self-pay

## 2024-09-10 ENCOUNTER — Other Ambulatory Visit: Payer: Self-pay

## 2024-09-10 ENCOUNTER — Other Ambulatory Visit (HOSPITAL_COMMUNITY): Payer: Self-pay

## 2024-09-23 ENCOUNTER — Ambulatory Visit (INDEPENDENT_AMBULATORY_CARE_PROVIDER_SITE_OTHER): Admitting: Physician Assistant

## 2024-09-23 ENCOUNTER — Encounter: Payer: Self-pay | Admitting: Physician Assistant

## 2024-09-23 ENCOUNTER — Other Ambulatory Visit (HOSPITAL_COMMUNITY): Payer: Self-pay

## 2024-09-23 VITALS — BP 129/87 | HR 95 | Temp 98.1°F | Ht 62.0 in | Wt 198.8 lb

## 2024-09-23 DIAGNOSIS — Z8632 Personal history of gestational diabetes: Secondary | ICD-10-CM | POA: Diagnosis not present

## 2024-09-23 DIAGNOSIS — G43E09 Chronic migraine with aura, not intractable, without status migrainosus: Secondary | ICD-10-CM | POA: Diagnosis not present

## 2024-09-23 DIAGNOSIS — G5603 Carpal tunnel syndrome, bilateral upper limbs: Secondary | ICD-10-CM | POA: Diagnosis not present

## 2024-09-23 DIAGNOSIS — E063 Autoimmune thyroiditis: Secondary | ICD-10-CM

## 2024-09-23 MED ORDER — METFORMIN HCL ER 500 MG PO TB24
500.0000 mg | ORAL_TABLET | Freq: Every day | ORAL | 1 refills | Status: AC
  Filled 2024-09-23: qty 90, 90d supply, fill #0

## 2024-09-23 MED ORDER — LEVOTHYROXINE SODIUM 75 MCG PO TABS
75.0000 ug | ORAL_TABLET | Freq: Every day | ORAL | 3 refills | Status: AC
  Filled 2024-09-23: qty 90, 90d supply, fill #0

## 2024-09-23 MED ORDER — ERGOCALCIFEROL 1.25 MG (50000 UT) PO CAPS
1.0000 | ORAL_CAPSULE | ORAL | 1 refills | Status: AC
  Filled 2024-09-23: qty 12, 84d supply, fill #0

## 2024-09-23 MED ORDER — AMITRIPTYLINE HCL 50 MG PO TABS
50.0000 mg | ORAL_TABLET | Freq: Every day | ORAL | 1 refills | Status: AC
  Filled 2024-09-23 – 2024-09-26 (×4): qty 30, 30d supply, fill #0

## 2024-09-23 MED ORDER — NURTEC 75 MG PO TBDP
1.0000 | ORAL_TABLET | Freq: Every day | ORAL | 1 refills | Status: AC
  Filled 2024-09-23: qty 30, 30d supply, fill #0
  Filled 2024-09-26: qty 8, 30d supply, fill #0
  Filled 2024-09-26 (×2): qty 30, 30d supply, fill #0

## 2024-09-23 NOTE — Assessment & Plan Note (Signed)
 Chronic migraines occur at least once a week, with severe episodes requiring dark room rest. Current treatment with amitriptyline  25 mg and Nurtec is insufficient. Stress and potential thyroid  dysfunction may contribute. Family history of migraines and strokes. - Increase amitriptyline  to 50 mg at bedtime for two weeks, then will consider increasing to 100 mg. Patient to touch base with me in 2 weeks via MyChart for potential dose adjustment.  - Referred to neurology for further migraine management and potential monthly injectable treatment. - Continue Nurtec as needed for acute migraine relief. - Encouraged consistent use of levothyroxine  to assess impact on migraines.

## 2024-09-23 NOTE — Assessment & Plan Note (Signed)
 Symptoms include numbness and tingling in both hands, more pronounced in the right hand, with wrist pain. Symptoms are worse at night and during random times during the day. Likely due to repetitive computer use and wrist positioning during sleep. - Recommended wearing wrist braces at night to prevent wrist flexion. - Advised use of ibuprofen  as needed for inflammation and pain. - Will consider referral to physical therapy or orthopedics if symptoms do not improve with conservative measures.

## 2024-09-23 NOTE — Assessment & Plan Note (Signed)
 Currently on metformin  500 mg daily to prevent recurrence of diabetes. Last A1c over 1 year ago.   - Ordered A1c test to assess current glycemic control. - Continue metformin  500 mg daily with supper.

## 2024-09-23 NOTE — Progress Notes (Signed)
 "  Established Patient Office Visit  Subjective   Patient ID: Pamela Bell, female    DOB: March 30, 1991  Age: 34 y.o. MRN: [id number]  Chief Complaint  Patient presents with   Follow-up    Migraine follow up Pt states migraines and headaches seem to be about the same    Migraine    1 x a week  Lasts from 12/1 PM until she goes to bed    Headache    Pt states she gets them about 3x a week and will last from 12 om until she goes to bed    Discussed the use of AI scribe software for clinical note transcription with the patient, who gave verbal consent to proceed.  History of Present Illness Pamela Bell is a 34 year old female with a history of migraines who presents with persistent headaches.  She experiences ongoing headaches despite discontinuing Zoloft  and taking amitriptyline  nightly. Severe headaches occur at least once or twice a week, necessitating rest in a dark room. She uses Nurtec two to three times weekly, but its effectiveness has waned. Initially, she tries Tylenol , but if ineffective, she proceeds with Nurtec. She is concerned that inconsistent use of levothyroxine  may contribute to her headaches.She also has Hashimoto's thyroiditis and has not been consistent with her levothyroxine  medication, due to caregiver responsibilities.There is a strong family history of migraines and headaches on both sides of her family.  She experiences numbness and tingling in her hands, particularly at night and sometimes during work. The symptoms are more pronounced in her right hand, which she uses dominantly. 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  She has a history of gestational diabetes and is currently on metformin  500 mg once daily to prevent the onset of diabetes. She has not had her A1c checked recently.   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    Review of Systems   Constitutional:  Negative for activity change, appetite change, fatigue and fever.  Eyes:  Negative for visual disturbance.  Respiratory:  Negative for cough and shortness of breath.   Cardiovascular:  Negative for chest pain.  Musculoskeletal:  Positive for arthralgias. Negative for back pain, gait problem and joint swelling.  Neurological:  Positive for headaches. Negative for light-headedness.  Psychiatric/Behavioral:  Negative for agitation and decreased concentration. The patient is not nervous/anxious.        Objective:     BP 129/87   Pulse 95   Temp 98.1 F (36.7 C)   Ht 5' 2 (1.575 m)   Wt 198 lb 12.8 oz (90.2 kg)   SpO2 99%   BMI 36.36 kg/m    Physical Exam Constitutional:      General: She is not in acute distress.    Appearance: Normal appearance. She is obese. She is not ill-appearing.  HENT:     Head: Normocephalic and atraumatic.     Mouth/Throat:     Mouth: Mucous membranes are moist.     Pharynx: Oropharynx is clear.  Eyes:     Extraocular Movements: Extraocular movements intact.     Conjunctiva/sclera: Conjunctivae normal.  Cardiovascular:     Rate and Rhythm: Normal rate and regular rhythm.     Heart sounds: Normal heart sounds. No murmur heard. Pulmonary:     Effort: Pulmonary effort is normal.     Breath sounds: Normal breath  sounds.  Skin:    General: Skin is warm and dry.  Neurological:     General: No focal deficit present.     Mental Status: She is alert and oriented to person, place, and time.     Cranial Nerves: No cranial nerve deficit.  Psychiatric:        Mood and Affect: Mood normal.        Behavior: Behavior normal.     No results found for any visits on 09/23/24.  The ASCVD Risk score (Arnett DK, et al., 2019) failed to calculate for the following reasons:   The 2019 ASCVD risk score is only valid for ages 20 to 40   * - Cholesterol units were assumed    Assessment & Plan:   Return in about 3 months (around 12/22/2024)  for headaches.   Chronic migraine with aura without status migrainosus, not intractable Assessment & Plan: Chronic migraines occur at least once a week, with severe episodes requiring dark room rest. Current treatment with amitriptyline  25 mg and Nurtec is insufficient. Stress and potential thyroid  dysfunction may contribute. Family history of migraines and strokes. - Increase amitriptyline  to 50 mg at bedtime for two weeks, then will consider increasing to 100 mg. Patient to touch base with me in 2 weeks via MyChart for potential dose adjustment.  - Referred to neurology for further migraine management and potential monthly injectable treatment. - Continue Nurtec as needed for acute migraine relief. - Encouraged consistent use of levothyroxine  to assess impact on migraines.  Orders: -     Amitriptyline  HCl; Take 1 tablet (50 mg total) by mouth at bedtime.  Dispense: 30 tablet; Refill: 1 -     Nurtec; Take 1 tablet (75 mg total) by mouth daily.  Dispense: 30 tablet; Refill: 1 -     Ambulatory referral to Neurology  Bilateral carpal tunnel syndrome Assessment & Plan: Symptoms include numbness and tingling in both hands, more pronounced in the right hand, with wrist pain. Symptoms are worse at night and during random times during the day. Likely due to repetitive computer use and wrist positioning during sleep. - Recommended wearing wrist braces at night to prevent wrist flexion. - Advised use of ibuprofen  as needed for inflammation and pain. - Will consider referral to physical therapy or orthopedics if symptoms do not improve with conservative measures.   History of gestational diabetes Assessment & Plan: Currently on metformin  500 mg daily to prevent recurrence of diabetes. Last A1c over 1 year ago.   - Ordered A1c test to assess current glycemic control. - Continue metformin  500 mg daily with supper.  Orders: -     Hemoglobin A1c -     metFORMIN  HCl ER; Take 1 tablet (500 mg total)  by mouth daily with supper.  Dispense: 90 tablet; Refill: 1  Hashimoto thyroiditis Assessment & Plan: Non-adherence to levothyroxine  due to caregiving responsibilities. Potential impact on migraine symptoms. - Encouraged resumption of levothyroxine  75 mcg daily on an empty stomach. - Will reassess thyroid  function after consistent medication use for at least one month.  Orders: -     Levothyroxine  Sodium; Take 1 tablet (75 mcg total) by mouth daily on an empty stomach  Dispense: 30 tablet; Refill: 3  Other orders -     Ergocalciferol ; Take 1 capsule (50,000 Units total) by mouth once a week.  Dispense: 12 capsule; Refill: 1     Timika Muench, PA-C "

## 2024-09-23 NOTE — Assessment & Plan Note (Signed)
 Non-adherence to levothyroxine  due to caregiving responsibilities. Potential impact on migraine symptoms. - Encouraged resumption of levothyroxine  75 mcg daily on an empty stomach. - Will reassess thyroid  function after consistent medication use for at least one month.

## 2024-09-24 ENCOUNTER — Ambulatory Visit: Payer: Self-pay | Admitting: Physician Assistant

## 2024-09-24 ENCOUNTER — Other Ambulatory Visit: Payer: Self-pay | Admitting: Physician Assistant

## 2024-09-24 ENCOUNTER — Other Ambulatory Visit (HOSPITAL_COMMUNITY): Payer: Self-pay

## 2024-09-24 ENCOUNTER — Other Ambulatory Visit: Payer: Self-pay

## 2024-09-24 ENCOUNTER — Encounter: Payer: Self-pay | Admitting: Neurology

## 2024-09-24 DIAGNOSIS — G43E09 Chronic migraine with aura, not intractable, without status migrainosus: Secondary | ICD-10-CM

## 2024-09-24 LAB — HEMOGLOBIN A1C
Est. average glucose Bld gHb Est-mCnc: 111 mg/dL
Hgb A1c MFr Bld: 5.5 % (ref 4.8–5.6)

## 2024-09-26 ENCOUNTER — Other Ambulatory Visit (HOSPITAL_COMMUNITY): Payer: Self-pay

## 2024-09-26 ENCOUNTER — Other Ambulatory Visit: Payer: Self-pay

## 2024-11-11 ENCOUNTER — Encounter: Admitting: Physician Assistant

## 2024-11-14 ENCOUNTER — Ambulatory Visit: Payer: Self-pay | Admitting: Neurology

## 2024-12-23 ENCOUNTER — Ambulatory Visit: Admitting: Physician Assistant
